# Patient Record
Sex: Male | Born: 1975 | Race: Black or African American | Hispanic: No | Smoking: Former smoker
Health system: Southern US, Community
[De-identification: ages and names within clinical notes are randomized; demographics above are authoritative.]

## PROBLEM LIST (undated history)

## (undated) DIAGNOSIS — Z720 Tobacco use: Secondary | ICD-10-CM

## (undated) DIAGNOSIS — K219 Gastro-esophageal reflux disease without esophagitis: Secondary | ICD-10-CM

## (undated) DIAGNOSIS — M109 Gout, unspecified: Secondary | ICD-10-CM

## (undated) DIAGNOSIS — E119 Type 2 diabetes mellitus without complications: Secondary | ICD-10-CM

## (undated) DIAGNOSIS — I739 Peripheral vascular disease, unspecified: Secondary | ICD-10-CM

## (undated) HISTORY — DX: Peripheral vascular disease, unspecified: I73.9

## (undated) HISTORY — PX: NO PAST SURGERIES: SHX2092

---

## 2008-03-18 ENCOUNTER — Emergency Department (HOSPITAL_COMMUNITY): Admission: EM | Admit: 2008-03-18 | Discharge: 2008-03-18 | Payer: Self-pay | Admitting: Emergency Medicine

## 2011-02-10 ENCOUNTER — Inpatient Hospital Stay (INDEPENDENT_AMBULATORY_CARE_PROVIDER_SITE_OTHER)
Admission: RE | Admit: 2011-02-10 | Discharge: 2011-02-10 | Disposition: A | Discharge status: Home / Self Care | Payer: Self-pay | Source: Ambulatory Visit | Attending: Family Medicine | Admitting: Family Medicine

## 2011-02-10 DIAGNOSIS — H6980 Other specified disorders of Eustachian tube, unspecified ear: Secondary | ICD-10-CM

## 2011-02-10 DIAGNOSIS — K219 Gastro-esophageal reflux disease without esophagitis: Secondary | ICD-10-CM

## 2011-02-10 DIAGNOSIS — F17200 Nicotine dependence, unspecified, uncomplicated: Secondary | ICD-10-CM

## 2011-02-10 DIAGNOSIS — G5620 Lesion of ulnar nerve, unspecified upper limb: Secondary | ICD-10-CM

## 2011-07-13 NOTE — Op Note (Signed)
  NAME:  Fred Green, Fred Green               ACCOUNT NO.:  [id number]  MEDICAL RECORD NO.:                     PATIENT TYPE:  LOCATION:                                 FACILITY:  PHYSICIAN:  Cindee Salt, M.D.            DATE OF BIRTH:  DATE OF PROCEDURE:  03/02/2011 DATE OF DISCHARGE:                              OPERATIVE REPORT   PREOPERATIVE DIAGNOSIS:  Carpal tunnel syndrome, right hand.  POSTOPERATIVE DIAGNOSIS:  Carpal tunnel syndrome, right hand.  OPERATION:  Decompression, right median nerve.  SURGEON:  Cindee Salt, MD  ASSISTANT:  None.  ANESTHESIA:  Forearm-based IV regional with local infiltration.  ANESTHESIOLOGIST:  Burna Forts, MD  HISTORY:  The patient is a 35 year old male with a history of carpal tunnel syndrome, EMG nerve conductions positive, which has not responded to conservative treatment.  He has elected to undergo surgical decompression.  Pre, peri, and postoperative course have been discussed along with risks and complications.  He is aware that there is no guarantee with surgery, possibility of infection, recurrence injury to arteries, nerves, tendons, incomplete relief of symptoms, dystrophy.  In the preoperative area, the patient is seen.  The extremity marked by both the patient and surgeon.  Antibiotic given.  PROCEDURE:  The patient was brought to the operating room where a forearm-based IV regional anesthetic was carried out without difficulty. Was prepped using ChloraPrep, supine position, right arm free.  A 3- minute dry time was allowed.  Time-out taken confirming the patient and procedure.  A longitudinal incision was made in the palm, carried down through subcutaneous tissue.  Bleeders were electrocauterized.  Palmar fascia was split.  Superficial palmar arch identified.  The flexor tendon to the ring little finger identified to the ulnar side of the median nerve.  The carpal retinaculum was incised with sharp dissection. A right-angle  and Sewall retractor were placed between skin and forearm fascia.  Fascia was released for approximately 1.5 cm proximal to the wrist crease under direct vision.  Canal was explored.  Air compression to the nerve was apparent.  No further lesions were identified.  The wound was irrigated.  Skin closed with interrupted 5-0 Vicryl Rapide sutures.  A local infiltration with 0.25% Marcaine without epinephrine was given, approximately 6 mL was used.  Sterile compressive dressing splint to the wrist with fingers free was applied.  On deflation of the tourniquet, all fingers immediately pinked.  He was taken to the recovery room for observation in satisfactory condition.  He will be discharged to home to return to the [facility] of [place] in 1 week on Vicodin.          ______________________________ Cindee Salt, M.D.     GK/MEDQ  D:  03/02/2011  T:  03/03/2011  Job:  [id number]  Electronically Signed by Cindee Salt M.D. on 07/13/2011 08:58:06 AM

## 2011-09-17 LAB — POCT I-STAT, CHEM 8
Calcium, Ion: 1.15
Creatinine, Ser: 1.3
Glucose, Bld: 95
Hemoglobin: 17
Potassium: 4.2

## 2011-09-17 LAB — URINALYSIS, ROUTINE W REFLEX MICROSCOPIC
Bilirubin Urine: NEGATIVE
Hgb urine dipstick: NEGATIVE
Ketones, ur: NEGATIVE
Nitrite: NEGATIVE
Specific Gravity, Urine: 1.023
Urobilinogen, UA: 1

## 2014-10-07 ENCOUNTER — Emergency Department (HOSPITAL_COMMUNITY): Payer: Worker's Compensation

## 2014-10-07 ENCOUNTER — Emergency Department (HOSPITAL_COMMUNITY)
Admission: EM | Admit: 2014-10-07 | Discharge: 2014-10-07 | Disposition: A | Discharge status: Home / Self Care | Payer: Worker's Compensation | Attending: Emergency Medicine | Admitting: Emergency Medicine

## 2014-10-07 ENCOUNTER — Encounter (HOSPITAL_COMMUNITY): Payer: Self-pay | Admitting: Emergency Medicine

## 2014-10-07 DIAGNOSIS — Z23 Encounter for immunization: Secondary | ICD-10-CM | POA: Diagnosis not present

## 2014-10-07 DIAGNOSIS — S62665B Nondisplaced fracture of distal phalanx of left ring finger, initial encounter for open fracture: Secondary | ICD-10-CM | POA: Insufficient documentation

## 2014-10-07 DIAGNOSIS — Z720 Tobacco use: Secondary | ICD-10-CM | POA: Diagnosis not present

## 2014-10-07 DIAGNOSIS — W230XXA Caught, crushed, jammed, or pinched between moving objects, initial encounter: Secondary | ICD-10-CM | POA: Insufficient documentation

## 2014-10-07 DIAGNOSIS — Y9269 Other specified industrial and construction area as the place of occurrence of the external cause: Secondary | ICD-10-CM | POA: Diagnosis not present

## 2014-10-07 DIAGNOSIS — S6992XA Unspecified injury of left wrist, hand and finger(s), initial encounter: Secondary | ICD-10-CM | POA: Diagnosis present

## 2014-10-07 DIAGNOSIS — Y990 Civilian activity done for income or pay: Secondary | ICD-10-CM | POA: Insufficient documentation

## 2014-10-07 MED ORDER — OXYCODONE-ACETAMINOPHEN 5-325 MG PO TABS
2.0000 | ORAL_TABLET | Freq: Once | ORAL | Status: AC
  Administered 2014-10-07: 2 via ORAL
  Filled 2014-10-07: qty 2

## 2014-10-07 MED ORDER — LIDOCAINE HCL 2 % IJ SOLN
10.0000 mL | Freq: Once | INTRAMUSCULAR | Status: AC
  Administered 2014-10-07: 200 mg via INTRADERMAL
  Filled 2014-10-07: qty 20

## 2014-10-07 MED ORDER — CEPHALEXIN 500 MG PO CAPS
500.0000 mg | ORAL_CAPSULE | Freq: Once | ORAL | Status: AC
  Administered 2014-10-07: 500 mg via ORAL
  Filled 2014-10-07: qty 1

## 2014-10-07 MED ORDER — OXYCODONE-ACETAMINOPHEN 5-325 MG PO TABS: 1.0000 | ORAL_TABLET | Freq: Four times a day (QID) | ORAL | Status: DC | PRN

## 2014-10-07 MED ORDER — TETANUS-DIPHTH-ACELL PERTUSSIS 5-2.5-18.5 LF-MCG/0.5 IM SUSP
0.5000 mL | Freq: Once | INTRAMUSCULAR | Status: AC
  Administered 2014-10-07: 0.5 mL via INTRAMUSCULAR
  Filled 2014-10-07: qty 0.5

## 2014-10-07 MED ORDER — CEPHALEXIN 500 MG PO CAPS: 1000.0000 mg | ORAL_CAPSULE | Freq: Two times a day (BID) | ORAL | Status: DC

## 2014-10-07 NOTE — Discharge Instructions (Signed)
Please follow up with hand specialist, Dr. Janee Mornhompson next week for further management of your broken finger.  Take antibiotic and pain medication as prescribed.  Avoid pain medication use while driving or operating heavy machinery as it may cause drowsiness.    Fingertip Injuries and Amputations Fingertip injuries are common and often get injured because they are last to escape when pulling your hand out of harm's way. You have amputated (cut off) part of your finger. How this turns out depends largely on how much was amputated. If just the tip is amputated, often the end of the finger will grow back and the finger may return to much the same as it was before the injury.  If more of the finger is missing, your caregiver has done the best with the tissue remaining to allow you to keep as much finger as is possible. Your caregiver after checking your injury has tried to leave you with a painless fingertip that has durable, feeling skin. If possible, your caregiver has tried to maintain the finger's length and appearance and preserve its fingernail.  Please read the instructions outlined below and refer to this sheet in the next few weeks. These instructions provide you with general information on caring for yourself. Your caregiver may also give you specific instructions. While your treatment has been done according to the most current medical practices available, unavoidable complications occasionally occur. If you have any problems or questions after discharge, please call your caregiver. HOME CARE INSTRUCTIONS   You may resume normal diet and activities as directed or allowed.  Keep your hand elevated above the level of your heart. This helps decrease pain and swelling.  Keep ice packs (or a bag of ice wrapped in a towel) on the injured area for 15-20 minutes, 03-04 times per day, for the first two days.  Change dressings if necessary or as directed.  Clean the wound daily or as directed.  Only  take over-the-counter or prescription medicines for pain, discomfort, or fever as directed by your caregiver.  Keep appointments as directed. SEEK IMMEDIATE MEDICAL CARE IF:  You develop redness, swelling, numbness or increasing pain in the wound.  There is pus coming from the wound.  You develop an unexplained oral temperature above 102 F (38.9 C) or as your caregiver suggests.  There is a foul (bad) smell coming from the wound or dressing.  There is a breaking open of the wound (edges not staying together) after sutures or staples have been removed. MAKE SURE YOU:   Understand these instructions.  Will watch your condition.  Will get help right away if you are not doing well or get worse. Document Released: 10/31/2005 Document Revised: 03/03/2012 Document Reviewed: 09/29/2008 [facility] Patient Information 2015 [employer] [place]. This information is not intended to replace advice given to you by your health care provider. Make sure you discuss any questions you have with your health care provider.

## 2014-10-07 NOTE — ED Notes (Signed)
Injury to tip of ring finger (4th finger) , l/hand. Pt was using a piece of cutting equipment at work. Tip of finger and nail was cut. Bleeding slowed with light pressure. Pt is alert, oriented and approtriate

## 2014-10-07 NOTE — ED Provider Notes (Signed)
CSN: [id number]     Arrival date & time 10/07/14  1304 History  This chart was scribed for non-physician practitioner working with Samuel JesterKathleen McManus, DO by Elveria Risingimelie Horne, ED Scribe. This patient was seen in room WTR7/WTR7 and the patient's care was started at 1:55 PM.   Chief Complaint  Patient presents with  . Finger Injury    avulsion of fingertip, slow bleed   The history is provided by the patient. No language interpreter was used.   HPI Comments: Fred Green is a 38 y.o. male who presents to the Emergency Department with left fourth finger injury incurred today at work, two hours ago. Patient works with [employer]; he states that he got his finger caught in conveyor belt. He attempted to yank his finger out effectively removing his nail and severing the distal tip of his fourth left finger. Patient went to US. There he was given digital block, dressing and referred to ED. Patient has not performed wound care or taken any medication.    Patient is uncertain of date of last Tetanus and is due for an update.   History reviewed. No pertinent past medical history. History reviewed. No pertinent past surgical history. Family History  Problem Relation Age of Onset  . Diabetes Mother   . Stroke Mother   . Cancer Mother   . Heart failure Father    History  Substance Use Topics  . Smoking status: Current Every Day Smoker    Types: Cigarettes  . Smokeless tobacco: Not on file  . Alcohol Use: No    Review of Systems  Constitutional: Negative for fever.  Neurological: Negative for weakness and numbness.   Allergies  Review of patient's allergies indicates no known allergies.  Home Medications   Prior to Admission medications   Not on File   Triage Vitals: BP 149/94  Pulse 92  Temp(Src) 98.4 F (36.9 C) (Oral)  Resp 20  SpO2 100%  Physical Exam  Nursing note and vitals reviewed. Constitutional: He is oriented to person, place, and time. He appears well-developed and  well-nourished. No distress.  HENT:  Head: Normocephalic and atraumatic.  Eyes: EOM are normal.  Neck: Neck supple.  Cardiovascular: Normal rate.   Pulmonary/Chest: Effort normal. No respiratory distress.  Musculoskeletal: Normal range of motion.  Neurological: He is alert and oriented to person, place, and time.  Skin: Skin is warm and dry.  Complete avulsion of nail in to bed with partial distal amputation of the left ring finger.   Psychiatric: He has a normal mood and affect. His behavior is normal.    ED Course  Procedures (including critical care time)  COORDINATION OF CARE: 1:58 PM- patient awaiting ordered pain medication and X-ray. Plans to repair. Discussed treatment plan with patient at bedside and patient agreed to plan.   2:44 PM Pt with an open fx of L index finger with complete avulsion of nail with a medial partial laceration.  Xray demonstrates multiple small densisites in the soft tissues suggesting possible small bone fragments or debris.  I discussed this finding with hand specialist, Dr. Janee Mornhompson who recommend irrigate wound, suture with chromic, xeroform dressing to nail bed and apply dressing. Will place finger in finger splint.  Work note provided.  Pt will f/u with him next week for further care.    4:09 PM LACERATION REPAIR Performed by: Fayrene HelperRAN,Johonna Binette Authorized byFayrene Helper: Evva Din Consent: Verbal consent obtained. Risks and benefits: risks, benefits and alternatives were discussed Consent given by: patient Patient identity  confirmed: provided demographic data Prepped and Draped in normal sterile fashion Wound explored  Laceration Location: L ring finger  Laceration Length: 2cm  No Foreign Bodies seen or palpated  Anesthesia: digital block  Local anesthetic: lidocaine 2% w/o epinephrine  Anesthetic total: 8 ml  Irrigation method: syringe Amount of cleaning: standard  Skin closure: chromic 5.0  Number of sutures: 7  Technique: simple interrupted,  surgical debridement with surgical scissor, skin approximation.  Patient tolerance: Patient tolerated the procedure well with no immediate complications.   Labs Review Labs Reviewed - No data to display  Imaging Review Dg Finger Ring Left  10/07/2014   CLINICAL DATA:  Left fourth finger tip laceration in machine at work.  EXAM: LEFT RING FINGER 2+V  COMPARISON:  None.  FINDINGS: Multiple small densities are seen in the soft tissues distal to the distal tuft of the fourth distal phalanx. It is uncertain if these represent small fractures or potentially debris or other foreign bodies. No other fracture or dislocation is noted. Joint spaces appear to be intact.  IMPRESSION: Multiple small densities are noted in the soft tissues distal to the distal tuft of the fourth distal phalanx concerning for possible small bone fragments or possibly debris or other foreign bodies.   Electronically Signed   By: Roque LiasJames  Green M.D.   On: 10/07/2014 14:12     EKG Interpretation None      MDM   Final diagnoses:  Nondisplaced fracture of distal phalanx of left ring finger, open, initial encounter    BP 149/94  Pulse 92  Temp(Src) 98.4 F (36.9 C) (Oral)  Resp 20  SpO2 100%  I have reviewed nursing notes and vital signs. I personally reviewed the imaging tests through PACS system  I reviewed available ER/hospitalization records thought the EMR   I personally performed the services described in this documentation, which was scribed in my presence. The recorded information has been reviewed and is accurate.    Fayrene HelperBowie Lanson Randle, PA-C 10/07/14 [phone]

## 2014-10-08 NOTE — ED Provider Notes (Signed)
Medical screening examination/treatment/procedure(s) were performed by non-physician practitioner and as supervising physician I was immediately available for consultation/collaboration.   EKG Interpretation None        Samuel JesterKathleen Nikol Lemar, DO 10/08/14 [phone]

## 2015-02-14 ENCOUNTER — Emergency Department (HOSPITAL_COMMUNITY)
Admission: EM | Admit: 2015-02-14 | Discharge: 2015-02-14 | Disposition: A | Discharge status: Home / Self Care | Payer: Self-pay | Attending: Emergency Medicine | Admitting: Emergency Medicine

## 2015-02-14 ENCOUNTER — Encounter (HOSPITAL_COMMUNITY): Payer: Self-pay

## 2015-02-14 DIAGNOSIS — K088 Other specified disorders of teeth and supporting structures: Secondary | ICD-10-CM | POA: Insufficient documentation

## 2015-02-14 DIAGNOSIS — Z792 Long term (current) use of antibiotics: Secondary | ICD-10-CM | POA: Insufficient documentation

## 2015-02-14 DIAGNOSIS — Z720 Tobacco use: Secondary | ICD-10-CM | POA: Insufficient documentation

## 2015-02-14 DIAGNOSIS — K0889 Other specified disorders of teeth and supporting structures: Secondary | ICD-10-CM

## 2015-02-14 MED ORDER — PENICILLIN V POTASSIUM 500 MG PO TABS: 500.0000 mg | ORAL_TABLET | Freq: Four times a day (QID) | ORAL | Status: AC

## 2015-02-14 MED ORDER — BUPIVACAINE-EPINEPHRINE (PF) 0.5% -1:200000 IJ SOLN
1.8000 mL | Freq: Once | INTRAMUSCULAR | Status: AC
  Administered 2015-02-14: 1.8 mL
  Filled 2015-02-14: qty 1.8

## 2015-02-14 MED ORDER — PENICILLIN V POTASSIUM 500 MG PO TABS
500.0000 mg | ORAL_TABLET | Freq: Once | ORAL | Status: AC
  Administered 2015-02-14: 500 mg via ORAL
  Filled 2015-02-14: qty 1

## 2015-02-14 MED ORDER — HYDROCODONE-ACETAMINOPHEN 5-325 MG PO TABS: 1.0000 | ORAL_TABLET | ORAL | Status: DC | PRN

## 2015-02-14 NOTE — Discharge Instructions (Signed)
Return to the emergency room with worsening of symptoms, new symptoms or with symptoms that are concerning, especially fevers, nausea, vomiting, drooling, unable to open mouth, worsening facial swelling, chest pain, shortness of breath. Please take all of your antibiotics until finished!   You may develop abdominal discomfort or diarrhea from the antibiotic.  You may help offset this with probiotics which you can buy or get in yogurt. Do not eat  or take the probiotics until 2 hours after your antibiotic.  Ibuprofen  (2 tablets ) every 5-6 hours for 3-5 days. Norco for severe pain. Do not operate machinery, drive or drink alcohol while taking narcotics or muscle relaxers. Use below resources to establish care with dentist as soon as possible. Also call to make appointment with wellness center to establish care. Your blood pressure was elevated. Have your blood pressure rechecked. Read below information and follow recommendations.   Dental Pain A tooth ache may be caused by cavities (tooth decay). Cavities expose the nerve of the tooth to air and hot or cold temperatures. It may come from an infection or abscess (also called a boil or furuncle) around your tooth. It is also often caused by dental caries (tooth decay). This causes the pain you are having. DIAGNOSIS  Your caregiver can diagnose this problem by exam. TREATMENT   If caused by an infection, it may be treated with medications which kill germs (antibiotics) and pain medications as prescribed by your caregiver. Take medications as directed.  Only take over-the-counter or prescription medicines for pain, discomfort, or fever as directed by your caregiver.  Whether the tooth ache today is caused by infection or dental disease, you should see your dentist as soon as possible for further care. SEEK MEDICAL CARE IF: The exam and treatment you received today has been provided on an emergency basis only. This is not a substitute for  complete medical or dental care. If your problem worsens or new problems (symptoms) appear, and you are unable to meet with your dentist, call or return to this location. SEEK IMMEDIATE MEDICAL CARE IF:   You have a fever.  You develop redness and swelling of your face, jaw, or neck.  You are unable to open your mouth.  You have severe pain uncontrolled by pain medicine. MAKE SURE YOU:   Understand these instructions.  Will watch your condition.  Will get help right away if you are not doing well or get worse. Document Released: 12/10/2005 Document Revised: 03/03/2012 Document Reviewed: 07/28/2008 [facility] Patient Information 2015 [place], [place]. This information is not intended to replace advice given to you by your health care provider. Make sure you discuss any questions you have with your health care provider.   Emergency Department Resource Guide 1) Find a Doctor and Pay Out of Pocket Although you won't have to find out who is covered by your insurance plan, it is a good idea to ask around and get recommendations. You will then need to call the office and see if the doctor you have chosen will accept you as a new patient and what types of options they offer for patients who are self-pay. Some doctors offer discounts or will set up payment plans for their patients who do not have insurance, but you will need to ask so you aren't surprised when you get to your appointment.  2) Contact Your Local Health Department Not all health departments have doctors that can see patients for sick visits, but many do, so it is worth  a call to see if yours does. If you don't know where your local health department is, you can check in your phone book. The CDC also has a tool to help you locate your state's health department, and many state websites also have listings of all of their local health departments.  3) Find a Walk-in Clinic If your illness is not likely to be very severe or complicated,  you may want to try a walk in clinic. These are popping up all over the country in pharmacies, drugstores, and shopping centers. They're usually staffed by nurse practitioners or physician assistants that have been trained to treat common illnesses and complaints. They're usually fairly quick and inexpensive. However, if you have serious medical issues or chronic medical problems, these are probably not your best option.  No Primary Care Doctor: - Call Health Connect at  [phone] - they can help you locate a primary care doctor that  accepts your insurance, provides certain services, etc. - Physician Referral Service- [phone]  Chronic Pain Problems: Organization         Address  Phone   Notes  [facility]  [phone] Patients need to be referred by their primary care doctor.   Medication Assistance: Organization         Address  Phone   Notes  [facility] Medication [facility] [address] [place], [place] [address] [phone] --Must be a resident of [facility] -- Must have NO insurance coverage whatsoever (no Medicaid/ Medicare, etc.) -- The pt. MUST have a primary care doctor that directs their care regularly and follows them in the community   [facility]ist  [phone]   [employer]  [phone]    Agencies that provide inexpensive medical care: Organization         Address  Phone   Notes  [facility]  [phone]   [facility]    [phone]   [facility] [address] [place], [place] [address] [phone]   [facility] [address] [place] [place] [phone]   Planned Parenthood    [phone]   [facility]    [phone]   [facility]  [address], [place] Phone:  [phone], Fax:  [phone] Hours of Operation:  9 am - 6 pm, M-F.  Also accepts Medicaid/Medicare and  self-pay.  [facility]  [address], [place] Phone: [phone], Fax: [phone]. Hours of Operation:  8:30 am - 5:30 pm, M-F.  Also accepts Medicaid and self-pay.  [facility] [place] [address] [place] Phone: [phone]   [facility] [address] Natasha Bence [place], [place] [phone], Ext. [phone] Mondays & Thursdays: 7-9 AM.  First 15 patients are seen on a first come, first serve basis.    Medicaid-accepting [facility] Providers:  Organization         Address  Phone   Notes  [facility] [address],  [phone] Also accepts self-pay patients.  [facility] [address] Laurell Josephs [place], [place]  [phone]   [facility] [address], [place] [phone]   [facility] [address], [place] [phone]   Renaye Rakers [address] [place], [place]  7, [place]   [phone]) [phone] Only accepts [place] Access Medicaid patients after they have their name applied to their card.   Self-Pay (no insurance) in [facility]:  Organization         Address  Phone   Notes  Sickle Cell Patients, [facility] [address], [place] [phone]) [phone]   [facility] [address], [place] [phone]) [phone]   [facility]  [address], [place] [phone]   [facility]/Dr. Osei-Bonsu  [address] [place] [address], [place] or [phone], [place] [phone] Phone number for both [place] and [place] locations is the same.  [facility] [address], [place] [phone]) [phone]   [facility] [address] [place] [address], [place] or [address] [phone]) [phone]   [facility] [address], [place] [phone], phone;  [phone], fax Sees patients 1st and 3rd Saturday of every month.  Must not qualify for public or private insurance (i.e. Medicaid, Medicare, [place] Health Choice, Veterans' Benefits)  Household income should be no more than 200% of the poverty level The clinic cannot treat you if you are pregnant or think you are pregnant  Sexually transmitted diseases are not treated at the clinic.    Dental Care: Organization         Address  Phone  Notes  [facility] [address], [place] [phone]) [phone] Accepts children up to age 40 who are enrolled in [place]icaid or [place] Health Choice; pregnant women with a Medicaid card; and children who have applied for Medicaid or [place] Health Choice, but were declined, whose parents can pay a reduced fee at time of service.  [facility] [place]  [address], [place] [phone] Accepts children up to age 40 who are enrolled in [place]icaid or [place] Health Choice; pregnant women with a Medicaid card; and children who have applied for Medicaid or [place] Health Choice, but were declined, whose parents can pay a reduced fee at time of service.  [facility]  [address], [place] [phone]) [phone] Patients are seen by appointment only. Walk-ins are not accepted. [facility] will see patients 40 years of age and older. Monday - Tuesday (8am-5pm) Most Wednesdays (8:30-5pm) $30 per visit, cash only  [facility]  [address], [facility] [phone] Patients are seen by appointment only. Walk-ins are not accepted. [facility] will see patients 40 years of age and older. One Wednesday Evening (Monthly: Volunteer Based).  $30 per visit, cash only  [employer] [facility]  [phone] for adults; Children under age 554, call Graduate Pediatric Dentistry at [phone]) [phone]. Children aged 804-14, please call  [phone] to request a pediatric application.  Dental services are provided in all areas of dental care including fillings, crowns and bridges, complete and partial dentures, implants, gum treatment, root canals, and extractions. Preventive care is also provided. Treatment is provided to both adults and children. Patients are selected via a lottery and there is often a waiting list.   [facility] [address], [place]  [phone] [email or web address]   [facility] [address], [place], [place] [phone], Ext. [phone] Second and Fourth Thursday of each  month, opens at 6:30 AM; Clinic ends at 9 AM.  Patients are seen on a first-come first-served basis, and a limited number are seen during each clinic.   [facility]  [address], [place], [place] [phone]) [phone]   Eligibility Requirements You must have lived in [place], [place], or [place] counties for at least the last three months.   You cannot be eligible for state or federal sponsored [place] Cityhealthcare insurance, including CIGNAVeterans Administration, [place]icaid, or [employer]Medicare.   You generally cannot be eligible for healthcare insurance through your employer.    How to apply: Eligibility screenings are held every Tuesday and Wednesday afternoon from 1:00 pm until 4:00 pm. You do not need an appointment for the interview!  [facility] [address], [place], [place] [phone]   [facility]  [phone]   [facility]  [phone]   [facility]  [phone]    Behavioral Health Resources in the Community: Intensive Outpatient Programs Organization         Address  Phone  Notes  [facility] [address], [place], [place] [phone]   [facility] [address], [place], [place] [phone]   [facility] [address], [place], [place]   [phone]   [facility] [address],  [place], [place] [phone] or [phone]   Substance Abuse Resources Organization         Address  Phone  Notes  [facility]  [phone]   [facility]  [phone]   [facility]  [phone]   [facility]  [phone]   Residential & Outpatient Substance Abuse Program  [phone]   Psychological Services Organization         Address  Phone  Notes  [facility]  [phone]- [phone]   [facility]  [phone]- [phone]   [facility] [address], [place] [phone] or [phone]    Mobile Crisis Teams Organization         Address  Phone  Notes  [facility], Mobile Crisis Care Unit  [phone]   [facility]  [address] [place], [place] [phone]   Doristine LocksSharon DeEsch [address] [place] [phone]    Self-Help/Support Groups Organization         Address  Phone             Notes  [facility] [place] - variety of support groups  336- [phone] Call for more information  Narcotics Anonymous (NA), [facility] [address], [place]   2 meetings at this location   Statisticianesidential Treatment Programs Organization         Address  Phone  Notes  [facility] [address],    [place]  [phone]   [facility]  [address], [place] [phone], [place], [place] [phone]   [facility] [address], [place] [phone] Admissions: 8am-3pm M-F  [facility] [address]    [place], [place] [phone]   [facility] [address], [place], [place] [phone]   [facility] [address]  [place], [place] [phone]   [facility] - Intensive Outpatient [address], [place], [place] [phone]   [facility] (Addiction Recovery  Care Assoc[facility] [address]  [place],  [place] [phone] or [phone]   [facility] (RT[facility]) [address] [place], [place] [phone] Accepts Medicaid  [facility] [address]  [place] [phone] Substance Abuse/Addiction Treatment   [facility] Resources Organization         Address  Phone  Notes  [facility]  [phone]) [phone]   Angie FavaJulie Brannon, PhD [address], [address] [place], [place]   [phone]) [phone] or [phone]   [facility]   [address] [place], [place] [phone]   [facility] [address], [place], [place] [phone] Insurance/Medicaid/sponsorship through [facility]  Faith and Families [address]                                    [place], [place] [phone] Therapy/tele-psych/case  [facility] [address]   [place], [place] [phone]) [phone]    Dr. Lolly MustacheArfeen  [phone](336) [phone]   [facility]  [employer] [facility] 1) [address], [place] 2) [address], [place] 3)  [address], [place] [phone]  [phone](336) [phone]   [facility] Child Abuse Hotline [phone](336) [phone] or [phone](336) [phone] (After Hours)
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
## 2015-02-14 NOTE — ED Provider Notes (Signed)
CSN: [id number]     Arrival date & time 02/14/15  0805 History   First MD Initiated Contact with Patient 02/14/15 [phone]     Chief Complaint  Patient presents with  . Dental Pain  . Facial Swelling     (Consider location/radiation/quality/duration/timing/severity/associated sxs/prior Treatment) HPI  Rakesh R Conard is a 39 y.o. male presenting with 6 day history of dental pain left lower molar. He is also noted 2 days of facial swelling and is concerned with an abscess. He has been taking ibuprofen with some relief of his tenderness. No fevers, chills, nausea, vomiting. Patient denies any drooling says he has been spitting more. Decreased oral intake due to the pain. No chest pain, shortness of breath. History of diabetes. Patient cannot remember last time he went to the dentist.   History reviewed. No pertinent past medical history. History reviewed. No pertinent past surgical history. Family History  Problem Relation Age of Onset  . Diabetes Mother   . Stroke Mother   . Cancer Mother   . Heart failure Father    History  Substance Use Topics  . Smoking status: Current Every Day Smoker    Types: Cigarettes  . Smokeless tobacco: Not on file  . Alcohol Use: No    Review of Systems  Constitutional: Negative for fever and chills.  HENT: Positive for dental problem and facial swelling. Negative for drooling.   Cardiovascular: Negative for chest pain and palpitations.  Gastrointestinal: Negative for nausea and vomiting.  Neurological: Negative for weakness.      Allergies  Review of patient's allergies indicates no known allergies.  Home Medications   Prior to Admission medications   Medication Sig Start Date End Date Taking? Authorizing Provider  amoxicillin (AMOXIL) 500 MG capsule Take 500 mg by mouth once.   Yes Historical Provider, MD  ibuprofen (ADVIL,MOTRIN) 200 MG tablet Take 400 mg by mouth every 6 (six) hours as needed (For tooth pain.).   Yes Historical Provider,  MD  cephALEXin (KEFLEX) 500 MG capsule Take 2 capsules (1,000 mg total) by mouth 2 (two) times daily. Patient not taking: Reported on 02/14/2015 10/07/14   Fayrene Helper, PA-C  HYDROcodone-acetaminophen (NORCO/VICODIN) 5-325 MG per tablet Take 1-2 tablets by mouth every 4 (four) hours as needed. 02/14/15   Louann Sjogren, PA-C  oxyCODONE-acetaminophen (PERCOCET/ROXICET) 5-325 MG per tablet Take 1 tablet by mouth every 6 (six) hours as needed for moderate pain or severe pain. Patient not taking: Reported on 02/14/2015 10/07/14   Fayrene Helper, PA-C  penicillin v potassium (VEETID) 500 MG tablet Take 1 tablet (500 mg total) by mouth 4 (four) times daily. 02/14/15 02/21/15  Benetta Spar L Derriona Branscom, PA-C   BP 178/99 mmHg  Pulse 94  Temp(Src) 98.3 F (36.8 C) (Oral)  Resp 16  SpO2 100% Physical Exam  Constitutional: He appears well-developed and well-nourished. No distress.  HENT:  Head: Normocephalic and atraumatic.  Mouth/Throat: No oropharyngeal exudate.  No trismus or uvula deviation No lip, tongue swelling. No swelling under tongue or tenderness. Mild left-sided facial swelling. Patient handling secretions. No drooling. Patient with poor dentition. Patient with tenderness to left lower back molar with mild erythema in this gingiva and possible drainable abscess.   Eyes: Conjunctivae are normal. Right eye exhibits no discharge. Left eye exhibits no discharge.  Neck: Normal range of motion. Neck supple.  Left cervical adenopathy  Cardiovascular: Normal rate and regular rhythm.   Pulmonary/Chest: Effort normal and breath sounds normal. No respiratory distress. He has no  wheezes.  Abdominal: Soft. He exhibits no distension. There is no tenderness.  Neurological: He is alert. Coordination normal.  Skin: Skin is warm and dry. He is not diaphoretic.  Nursing note and vitals reviewed.   ED Course  NERVE BLOCK Date/Time: 02/14/2015 9:03 AM Performed by: Arilynn Blakeney L Authorized by: Oswaldo ConroyREECH,  Savvas Roper L Consent: Verbal consent obtained. Consent given by: patient Patient understanding: patient states understanding of the procedure being performed Patient consent: the patient's understanding of the procedure matches consent given Relevant documents: relevant documents present and verified Site marked: the operative site was marked Patient identity confirmed: verbally with patient Time out: Immediately prior to procedure a "time out" was called to verify the correct patient, procedure, equipment, support staff and site/side marked as required. Indications: pain relief Body area: face/mouth Nerve: inferior alveolar Local anesthetic: bupivacaine 0.5% with epinephrine Anesthetic total: 1.8 ml Outcome: pain improved Patient tolerance: Patient tolerated the procedure well with no immediate complications Comments: I and d of tooth abscess with minimal purulent drainage after incision with 18g needle   (including critical care time) Labs Review Labs Reviewed - No data to display  Imaging Review No results found.   EKG Interpretation None        MDM   Final diagnoses:  Pain, dental   Patient with toothache.  Questionable abscess verses gingival inflammation. No history of diabetes. Exam unconcerning for Ludwig's angina or spread of infection. Discussed nerve block and IandD with pt who is agreeable. Dental block with marcaine see above. Pt tolerated procedure without complications and decrease pain. I and D with minimal drainage.  Will treat with penicillin and pain medicine.  Driving and sedation precautions provided. Urged patient to follow-up with dentist.  ED resources provided.  Discussed return precautions with patient. Discussed all results and patient verbalizes understanding and agrees with plan.   Louann SjogrenVictoria L Angelea Penny, PA-C 02/14/15 1110  Ethelda ChickMartha K Linker, MD 02/14/15 1120

## 2015-02-14 NOTE — ED Notes (Signed)
Pt with dental pain starting Wednesday.  Noticed facial swelling last 2 days.

## 2016-01-11 ENCOUNTER — Encounter (HOSPITAL_COMMUNITY): Payer: Self-pay | Admitting: *Deleted

## 2016-01-11 ENCOUNTER — Emergency Department (HOSPITAL_COMMUNITY): Payer: Self-pay

## 2016-01-11 ENCOUNTER — Observation Stay (HOSPITAL_COMMUNITY)
Admission: EM | Admit: 2016-01-11 | Discharge: 2016-01-12 | Disposition: A | Discharge status: Home / Self Care | Payer: Self-pay | Attending: Internal Medicine | Admitting: Internal Medicine

## 2016-01-11 DIAGNOSIS — K219 Gastro-esophageal reflux disease without esophagitis: Secondary | ICD-10-CM | POA: Insufficient documentation

## 2016-01-11 DIAGNOSIS — F17210 Nicotine dependence, cigarettes, uncomplicated: Secondary | ICD-10-CM | POA: Insufficient documentation

## 2016-01-11 DIAGNOSIS — M549 Dorsalgia, unspecified: Secondary | ICD-10-CM | POA: Diagnosis present

## 2016-01-11 DIAGNOSIS — R55 Syncope and collapse: Secondary | ICD-10-CM

## 2016-01-11 DIAGNOSIS — Z720 Tobacco use: Secondary | ICD-10-CM | POA: Diagnosis present

## 2016-01-11 DIAGNOSIS — R079 Chest pain, unspecified: Secondary | ICD-10-CM

## 2016-01-11 DIAGNOSIS — R9431 Abnormal electrocardiogram [ECG] [EKG]: Principal | ICD-10-CM | POA: Insufficient documentation

## 2016-01-11 HISTORY — DX: Gastro-esophageal reflux disease without esophagitis: K21.9

## 2016-01-11 HISTORY — DX: Tobacco use: Z72.0

## 2016-01-11 LAB — COMPREHENSIVE METABOLIC PANEL
ALK PHOS: 78 U/L (ref 38–126)
ALT: 22 U/L (ref 17–63)
ANION GAP: 10 (ref 5–15)
AST: 19 U/L (ref 15–41)
Albumin: 4.5 g/dL (ref 3.5–5.0)
BUN: 12 mg/dL (ref 6–20)
CALCIUM: 9.9 mg/dL (ref 8.9–10.3)
CO2: 26 mmol/L (ref 22–32)
Chloride: 105 mmol/L (ref 101–111)
Creatinine, Ser: 0.92 mg/dL (ref 0.61–1.24)
GFR calc non Af Amer: >60 mL/min (ref >60)
Glucose, Bld: 107 mg/dL — ABNORMAL HIGH (ref 65–99)
POTASSIUM: 4 mmol/L (ref 3.5–5.1)
SODIUM: 141 mmol/L (ref 135–145)
Total Bilirubin: 0.7 mg/dL (ref 0.3–1.2)
Total Protein: 8 g/dL (ref 6.5–8.1)

## 2016-01-11 LAB — I-STAT CHEM 8, ED
BUN: 10 mg/dL (ref 6–20)
CHLORIDE: 102 mmol/L (ref 101–111)
Calcium, Ion: 1.16 mmol/L (ref 1.12–1.23)
Creatinine, Ser: 0.9 mg/dL (ref 0.61–1.24)
Glucose, Bld: 103 mg/dL — ABNORMAL HIGH (ref 65–99)
HEMATOCRIT: 48 % (ref 39.0–52.0)
Hemoglobin: 16.3 g/dL (ref 13.0–17.0)
Potassium: 3.7 mmol/L (ref 3.5–5.1)
SODIUM: 140 mmol/L (ref 135–145)
TCO2: 25 mmol/L (ref 0–100)

## 2016-01-11 LAB — CBC WITH DIFFERENTIAL/PLATELET
Basophils Absolute: 0 10*3/uL (ref 0.0–0.1)
Basophils Relative: 0 %
EOS ABS: 0.3 10*3/uL (ref 0.0–0.7)
EOS PCT: 3 %
HCT: 44.6 % (ref 39.0–52.0)
HEMOGLOBIN: 15.5 g/dL (ref 13.0–17.0)
LYMPHS ABS: 3.7 10*3/uL (ref 0.7–4.0)
Lymphocytes Relative: 38 %
MCH: 30.5 pg (ref 26.0–34.0)
MCHC: 34.8 g/dL (ref 30.0–36.0)
MCV: 87.8 fL (ref 78.0–100.0)
MONO ABS: 0.5 10*3/uL (ref 0.1–1.0)
MONOS PCT: 6 %
Neutro Abs: 5.1 10*3/uL (ref 1.7–7.7)
Neutrophils Relative %: 53 %
PLATELETS: 380 10*3/uL (ref 150–400)
RBC: 5.08 MIL/uL (ref 4.22–5.81)
RDW: 13.2 % (ref 11.5–15.5)
WBC: 9.6 10*3/uL (ref 4.0–10.5)

## 2016-01-11 LAB — PROTIME-INR
INR: 1.06 (ref 0.00–1.49)
Prothrombin Time: 14 seconds (ref 11.6–15.2)

## 2016-01-11 LAB — I-STAT TROPONIN, ED: TROPONIN I, POC: 0 ng/mL (ref 0.00–0.08)

## 2016-01-11 LAB — APTT: aPTT: 32 seconds (ref 24–37)

## 2016-01-11 LAB — RAPID URINE DRUG SCREEN, HOSP PERFORMED
AMPHETAMINES: NOT DETECTED
Barbiturates: NOT DETECTED
Benzodiazepines: NOT DETECTED
Cocaine: NOT DETECTED
OPIATES: NOT DETECTED
Tetrahydrocannabinol: NOT DETECTED

## 2016-01-11 LAB — TROPONIN I: Troponin I: <0.03 ng/mL (ref <0.031)

## 2016-01-11 MED ORDER — IBUPROFEN 200 MG PO TABS: 400.0000 mg | ORAL_TABLET | Freq: Three times a day (TID) | ORAL | Status: DC | PRN

## 2016-01-11 MED ORDER — NITROGLYCERIN 0.4 MG SL SUBL: 0.4000 mg | SUBLINGUAL_TABLET | SUBLINGUAL | Status: DC | PRN

## 2016-01-11 MED ORDER — ZOLPIDEM TARTRATE 5 MG PO TABS: 5.0000 mg | ORAL_TABLET | Freq: Every evening | ORAL | Status: DC | PRN

## 2016-01-11 MED ORDER — NICOTINE 14 MG/24HR TD PT24: 14.0000 mg | MEDICATED_PATCH | Freq: Every day | TRANSDERMAL | Status: DC

## 2016-01-11 MED ORDER — IOHEXOL 350 MG/ML SOLN
100.0000 mL | Freq: Once | INTRAVENOUS | Status: AC | PRN
  Administered 2016-01-11: 100 mL via INTRAVENOUS

## 2016-01-11 MED ORDER — ACETAMINOPHEN 325 MG PO TABS: 650.0000 mg | ORAL_TABLET | ORAL | Status: DC | PRN

## 2016-01-11 MED ORDER — HYDRALAZINE HCL 20 MG/ML IJ SOLN: 5.0000 mg | INTRAMUSCULAR | Status: DC | PRN

## 2016-01-11 MED ORDER — ONDANSETRON HCL 4 MG/2ML IJ SOLN: 4.0000 mg | Freq: Four times a day (QID) | INTRAMUSCULAR | Status: DC | PRN

## 2016-01-11 MED ORDER — SODIUM CHLORIDE 0.9 % IV SOLN
INTRAVENOUS | Status: DC
  Administered 2016-01-11: 21:00:00 via INTRAVENOUS

## 2016-01-11 MED ORDER — ASPIRIN 325 MG PO TABS
325.0000 mg | ORAL_TABLET | Freq: Every day | ORAL | Status: DC
  Administered 2016-01-12: 325 mg via ORAL
  Filled 2016-01-11: qty 1

## 2016-01-11 MED ORDER — PANTOPRAZOLE SODIUM 40 MG PO TBEC
40.0000 mg | DELAYED_RELEASE_TABLET | Freq: Every day | ORAL | Status: DC
  Administered 2016-01-12: 40 mg via ORAL
  Filled 2016-01-11: qty 1

## 2016-01-11 MED ORDER — ENOXAPARIN SODIUM 40 MG/0.4ML ~~LOC~~ SOLN
40.0000 mg | Freq: Every day | SUBCUTANEOUS | Status: DC
Start: 2016-01-11 — End: 2016-01-12
  Administered 2016-01-11: 40 mg via SUBCUTANEOUS
  Filled 2016-01-11 (×2): qty 0.4

## 2016-01-11 MED ORDER — ATORVASTATIN CALCIUM 40 MG PO TABS
40.0000 mg | ORAL_TABLET | Freq: Every day | ORAL | Status: DC
  Administered 2016-01-11: 40 mg via ORAL
  Filled 2016-01-11 (×2): qty 1

## 2016-01-11 NOTE — ED Provider Notes (Signed)
CSN: [id number]     Arrival date & time 01/11/16  1544 History  By signing my name below, I, Marica Otter, attest that this documentation has been prepared under the direction and in the presence of  [employer], PA-C. Electronically Signed: Marica Otter, ED Scribe. 01/11/2016. 4:50 PM.  Chief Complaint  Patient presents with  . Back Pain  . Near Syncope   HPI PCP: No primary care provider on file. HPI Comments: Fred Green is a 40 y.o. male who presents to the Emergency Department complaining of atraumatic, sudden onset, sharp, shooting, now resolved, 15 minute episode of upper back pain originating on the right upper back and shooting to the left upper back onset at 3pm today while pt was picking up papers from a box on the floor. Associated Sx include an improving headache also onset at 3pm today; and an episode of lightheadedness immediately following onset of the back pain which is now resolved. Pt denies chest pain, SOB, or any other Sx at this time. Pt further denies Hx of HTN.   Past Medical History  Diagnosis Date  . GERD (gastroesophageal reflux disease)   . Tobacco abuse    History reviewed. No pertinent past surgical history. Family History  Problem Relation Age of Onset  . Diabetes Mother   . Stroke Mother   . Cancer Mother   . Heart failure Father   . Heart attack Father    Social History  Substance Use Topics  . Smoking status: Current Every Day Smoker    Types: Cigarettes  . Smokeless tobacco: None  . Alcohol Use: No    Review of Systems  A complete 10 system review of systems was obtained and all systems are negative except as noted in the HPI and PMH.   Allergies  Review of patient's allergies indicates no known allergies.  Home Medications   Prior to Admission medications   Medication Sig Start Date End Date Taking? Authorizing Provider  ibuprofen (ADVIL,MOTRIN) 200 MG tablet Take 400 mg by mouth every 6 (six) hours as needed (For tooth  pain.).   Yes Historical Provider, MD  cephALEXin (KEFLEX) 500 MG capsule Take 2 capsules (1,000 mg total) by mouth 2 (two) times daily. Patient not taking: Reported on 02/14/2015 10/07/14   Fayrene Helper, PA-C  HYDROcodone-acetaminophen (NORCO/VICODIN) 5-325 MG per tablet Take 1-2 tablets by mouth every 4 (four) hours as needed. Patient not taking: Reported on 01/11/2016 02/14/15   Oswaldo Conroy, PA-C  oxyCODONE-acetaminophen (PERCOCET/ROXICET) 5-325 MG per tablet Take 1 tablet by mouth every 6 (six) hours as needed for moderate pain or severe pain. Patient not taking: Reported on 02/14/2015 10/07/14   Fayrene Helper, PA-C   Triage Vitals: BP 139/84 mmHg  Pulse 93  Temp(Src) 98.1 F (36.7 C) (Oral)  Resp 18  SpO2 96% Physical Exam  Constitutional: He is oriented to person, place, and time. He appears well-developed and well-nourished. No distress.  HENT:  Head: Normocephalic.  Mouth/Throat: Oropharynx is clear and moist.  Eyes: Conjunctivae and EOM are normal.  Neck: Normal range of motion. No JVD present. No tracheal deviation present.  Cardiovascular: Normal rate, regular rhythm and intact distal pulses.   Radial pulse equal bilaterally  Pulmonary/Chest: Effort normal and breath sounds normal. No stridor. No respiratory distress. He has no wheezes. He has no rales. He exhibits no tenderness.  Abdominal: Soft. He exhibits no distension and no mass. There is no tenderness. There is no rebound and no guarding.  Musculoskeletal: Normal  range of motion. He exhibits no edema or tenderness.  No calf asymmetry, superficial collaterals, palpable cords, edema, Homans sign negative bilaterally.    Neurological: He is alert and oriented to person, place, and time.  Skin: Skin is warm. He is not diaphoretic.  Psychiatric: He has a normal mood and affect.  Nursing note and vitals reviewed.  ED Course  Procedures (including critical care time) DIAGNOSTIC STUDIES: Oxygen Saturation is 96% on ra, nl by  my interpretation.    COORDINATION OF CARE: 4:42 PM: Discussed treatment plan which includes chest x-ray, EKG and labs with pt at bedside; patient verbalizes understanding and agrees with treatment plan.  Labs Review Labs Reviewed  COMPREHENSIVE METABOLIC PANEL - Abnormal; Notable for the following:    Glucose, Bld 107 (*)    All other components within normal limits  I-STAT CHEM 8, ED - Abnormal; Notable for the following:    Glucose, Bld 103 (*)    All other components within normal limits  CBC WITH DIFFERENTIAL/PLATELET  PROTIME-INR  URINE RAPID DRUG SCREEN, HOSP PERFORMED  I-STAT TROPOININ, ED    Imaging Review Dg Chest 2 View  01/11/2016  CLINICAL DATA:  Mild mid chest pain radiating to RIGHT upper back and LEFT upper back, smoker EXAM: CHEST  2 VIEW COMPARISON:  03/18/2008 FINDINGS: Minimal enlargement of cardiac silhouette. Slight pulmonary vascular congestion. Mediastinal contours normal. Lungs clear. No pleural effusion or pneumothorax. Bones unremarkable. IMPRESSION: Minimal enlargement of cardiac silhouette with pulmonary vascular congestion. No acute infiltrate. Electronically Signed   By: Ulyses Southward M.D.   On: 01/11/2016 17:12   Ct Angio Chest Aorta W/cm &/or Wo/cm  01/11/2016  CLINICAL DATA:  40 year old male with history of sudden onset of sharp shooting chest pain radiating from the right upper back to the left upper back at 3 p.m. today while picking up a heavy box off the floor. Headache. Lightheadedness. EXAM: CT ANGIOGRAPHY CHEST WITH CONTRAST TECHNIQUE: Multidetector CT imaging of the chest was performed using the standard protocol during bolus administration of intravenous contrast. Multiplanar CT image reconstructions and MIPs were obtained to evaluate the vascular anatomy. CONTRAST:  OMNIPAQUE IOHEXOL 350 MG/ML SOLN COMPARISON:  No priors. FINDINGS: Mediastinum/Lymph Nodes: Noncontrast images demonstrate no crescentic high attenuation associated with the wall of  the thoracic aorta to suggest acute intramural hemorrhage. No aneurysm or dissection identified in the thoracic aorta on post-contrast images. Heart size is normal. There is no significant pericardial fluid, thickening or pericardial calcification. No pathologically enlarged mediastinal or hilar lymph nodes. Esophagus is unremarkable in appearance. No axillary lymphadenopathy. Lungs/Pleura: No pneumothorax. No acute consolidative airspace disease. No pleural effusions. No suspicious appearing pulmonary nodules or masses. Upper Abdomen: Mild diffuse decreased attenuation throughout the hepatic parenchyma, compatible with hepatic steatosis. Musculoskeletal/Soft Tissues: There are no aggressive appearing lytic or blastic lesions noted in the visualized portions of the skeleton. Review of the MIP images confirms the above findings. IMPRESSION: 1. No acute findings in the thorax to account for the patient's symptoms. Specifically, no acute abnormality of the thoracic aorta. 2. Hepatic steatosis. Electronically Signed   By: Trudie Reed M.D.   On: 01/11/2016 19:29   I have personally reviewed and evaluated these images and lab results as part of my medical decision-making.   EKG Interpretation   Date/Time:  Wednesday January 11 2016 17:00:03 EST Ventricular Rate:  83 PR Interval:  180 QRS Duration: 95 QT Interval:  342 QTC Calculation: 402 R Axis:   -28 Text Interpretation:  Sinus or ectopic atrial rhythm Probable left atrial  enlargement Borderline left axis deviation Abnormal R-wave progression,  late transition Nonspecific T abnormalities, lateral leads Borderline ST  elevation, anterior leads Confirmed by Rubin Payor  MD, NATHAN [phone]) on  01/11/2016 5:07:38 PM      MDM   Final diagnoses:  Near syncope    Filed Vitals:   01/11/16 1549 01/11/16 1830 01/11/16 1924  BP: 139/84 119/75 98/57  Pulse: 93 157 82  Temp: 98.1 F (36.7 C)    TempSrc: Oral    Resp: SpO2: 96% 97% 98%     Medications  iohexol (OMNIPAQUE) 350 MG/ML injection 100 mL (100 mLs Intravenous Contrast Given 01/11/16 1855)    Fred Green is 40 y.o. male presenting with acute onset of severe intrascapular back pain, patient was lifting papers when it started. Patient had headache and was lightheaded and felt presyncopal. He did not pass out. He denies any anterior chest pain.  Discussed case with attending physician who agrees with care plan and disposition.    Patient's EKG with nonspecific changes from prior. Chest x-ray with no acute abnormalities, CT of chest to rule out dissection pending. Case signed out to [facility], will need admission for cardiac rule out.    Wynetta Emery, PA-C 01/11/16 2030  Arby Barrette, MD 01/11/16 [phone]

## 2016-01-11 NOTE — ED Notes (Signed)
MD at bedside. 

## 2016-01-11 NOTE — ED Notes (Signed)
Bed: OZ30 Expected date:  Expected time:  Means of arrival:  Comments: [id number]

## 2016-01-11 NOTE — ED Notes (Signed)
PT RETURNED FROM XRAY.

## 2016-01-11 NOTE — Progress Notes (Signed)
EDCM spoke to patient at bedside. Patient confirms he does not have a pcp or insurance living in [place].  [facility] provided patient with contact infromation to [facility], informed patient of services there.  EDCM also provided patient with list of pcps who accept self pay patients, list of discount pharmacies and websites needymeds.org and GoodRX.com for medication assistance, phone number to inquire about the orange card, phone number to inquire about Medicaid, phone number to inquire about the Affordable Care Act, financial resources in the community such as local churches, [employer], urban ministries, and dental assistance for uninsured patients.  Patient thankful for resources.  No further EDCM needs at this time.

## 2016-01-11 NOTE — ED Provider Notes (Signed)
This patient's care was assumed from [facility], PA-C at shift change. Please see her note for further. Briefly, this patient presented to the emergency department complaining of sudden onset of chest and upper back pain earlier today lasting approximately 15 minutes. On my evaluation patient has asymptomatic. He denies any chest pain. His chest x-ray shows mild cardiomegaly. EKG shows some nonspecific T abnormalities and some borderline ST elevation in lateral leads. No previous EKG to compare. Plan at shift change was to have a CT angiogram of the chest and admit for ACS rule out with EKG changes if CTA chest is normal.  CT angiogram chest is unremarkable. No evidence of abnormalities in the thoracic aorta.  I consulted with hospitalist Dr. Clyde Lundborg who accepted the patient for admission but would like a consult to cardiology to review the patient's EKG. Telemetry temporary admission orders place. I consulted with cardiologist Dr. Onalee Hua who evaluated the EKG and notes the borderline EKG changes. He advises as the patient is chest pain-free currently he is okay to stay in telemetry at [facility]. He reports that the patient has repeat chest pain to recheck an EKG and 50 has an elevated troponin to contact cardiology for likely cath and transfer to [facility]. I advised Dr. Clyde Lundborg of this and he agrees with plan.  The patient is in agreement with admission.   This patient was discussed with and evaluated by Dr. Donnald Garre who agrees with assessment and plan.   Results for orders placed or performed during the hospital encounter of 01/11/16  CBC with Differential  Result Value Ref Range   WBC 9.6 4.0 - 10.5 K/uL   RBC 5.08 4.22 - 5.81 MIL/uL   Hemoglobin 15.5 13.0 - 17.0 g/dL   HCT 16.1 09.6 - 04.5 %   MCV 87.8 78.0 - 100.0 fL   MCH 30.5 26.0 - 34.0 pg   MCHC 34.8 30.0 - 36.0 g/dL   RDW 40.9 81.1 - 91.4 %   Platelets 380 150 - 400 K/uL   Neutrophils Relative % 53 %   Neutro Abs 5.1 1.7 - 7.7 K/uL    Lymphocytes Relative 38 %   Lymphs Abs 3.7 0.7 - 4.0 K/uL   Monocytes Relative 6 %   Monocytes Absolute 0.5 0.1 - 1.0 K/uL   Eosinophils Relative 3 %   Eosinophils Absolute 0.3 0.0 - 0.7 K/uL   Basophils Relative 0 %   Basophils Absolute 0.0 0.0 - 0.1 K/uL  Comprehensive metabolic panel  Result Value Ref Range   Sodium 141 135 - 145 mmol/L   Potassium 4.0 3.5 - 5.1 mmol/L   Chloride 105 101 - 111 mmol/L   CO2 26 22 - 32 mmol/L   Glucose, Bld 107 (H) 65 - 99 mg/dL   BUN 12 6 - 20 mg/dL   Creatinine, Ser 7.82 0.61 - 1.24 mg/dL   Calcium 9.9 8.9 - 95.6 mg/dL   Total Protein 8.0 6.5 - 8.1 g/dL   Albumin 4.5 3.5 - 5.0 g/dL   AST 19 15 - 41 U/L   ALT 22 17 - 63 U/L   Alkaline Phosphatase 78 38 - 126 U/L   Total Bilirubin 0.7 0.3 - 1.2 mg/dL   GFR calc non Af Amer >60 >60 mL/min   GFR calc Af Amer >60 >60 mL/min   Anion gap 10 5 - 15  Protime-INR  Result Value Ref Range   Prothrombin Time 14.0 11.6 - 15.2 seconds   INR 1.06 0.00 - 1.49  I-Stat Chem 8, ED  Result Value Ref Range   Sodium 140 135 - 145 mmol/L   Potassium 3.7 3.5 - 5.1 mmol/L   Chloride 102 101 - 111 mmol/L   BUN 10 6 - 20 mg/dL   Creatinine, Ser 1.61 0.61 - 1.24 mg/dL   Glucose, Bld 096 (H) 65 - 99 mg/dL   Calcium, Ion 0.45 4.09 - 1.23 mmol/L   TCO2 25 0 - 100 mmol/L   Hemoglobin 16.3 13.0 - 17.0 g/dL   HCT 81.1 91.4 - 78.2 %  I-stat troponin, ED  Result Value Ref Range   Troponin i, poc 0.00 0.00 - 0.08 ng/mL   Comment 3           Dg Chest 2 View  01/11/2016  CLINICAL DATA:  Mild mid chest pain radiating to RIGHT upper back and LEFT upper back, smoker EXAM: CHEST  2 VIEW COMPARISON:  03/18/2008 FINDINGS: Minimal enlargement of cardiac silhouette. Slight pulmonary vascular congestion. Mediastinal contours normal. Lungs clear. No pleural effusion or pneumothorax. Bones unremarkable. IMPRESSION: Minimal enlargement of cardiac silhouette with pulmonary vascular congestion. No acute infiltrate. Electronically  Signed   By: Ulyses Southward M.D.   On: 01/11/2016 17:12   Ct Angio Chest Aorta W/cm &/or Wo/cm  01/11/2016  CLINICAL DATA:  40 year old male with history of sudden onset of sharp shooting chest pain radiating from the right upper back to the left upper back at 3 p.m. today while picking up a heavy box off the floor. Headache. Lightheadedness. EXAM: CT ANGIOGRAPHY CHEST WITH CONTRAST TECHNIQUE: Multidetector CT imaging of the chest was performed using the standard protocol during bolus administration of intravenous contrast. Multiplanar CT image reconstructions and MIPs were obtained to evaluate the vascular anatomy. CONTRAST:  OMNIPAQUE IOHEXOL 350 MG/ML SOLN COMPARISON:  No priors. FINDINGS: Mediastinum/Lymph Nodes: Noncontrast images demonstrate no crescentic high attenuation associated with the wall of the thoracic aorta to suggest acute intramural hemorrhage. No aneurysm or dissection identified in the thoracic aorta on post-contrast images. Heart size is normal. There is no significant pericardial fluid, thickening or pericardial calcification. No pathologically enlarged mediastinal or hilar lymph nodes. Esophagus is unremarkable in appearance. No axillary lymphadenopathy. Lungs/Pleura: No pneumothorax. No acute consolidative airspace disease. No pleural effusions. No suspicious appearing pulmonary nodules or masses. Upper Abdomen: Mild diffuse decreased attenuation throughout the hepatic parenchyma, compatible with hepatic steatosis. Musculoskeletal/Soft Tissues: There are no aggressive appearing lytic or blastic lesions noted in the visualized portions of the skeleton. Review of the MIP images confirms the above findings. IMPRESSION: 1. No acute findings in the thorax to account for the patient's symptoms. Specifically, no acute abnormality of the thoracic aorta. 2. Hepatic steatosis. Electronically Signed   By: Trudie Reed M.D.   On: 01/11/2016 19:29    Dx: Chest pain.   Abnormal EKG.    Everlene Farrier, PA-C 01/11/16 2053  Arby Barrette, MD 01/11/16 2100

## 2016-01-11 NOTE — H&P (Signed)
[facility] History and Physical  Lorry ARAN MENNING ZOX:[id number] DOB: Jul 27, 1976 DOA: 01/11/2016  Referring physician: ED physician PCP: No primary care provider on file.  Specialists:   Chief Complaint: Back pain  HPI: Fred Green is a 40 y.o. male with PMH of tobacco abuse, GERD, who presents with back pain.  Patient reports that he had back pain when he was moving heavy paper rolls at work at about 3 PM. His back pain started at right upper back, and then progressed to have acrossed his back, moved to his left upper back. The back pain lasted for a few seconds, then resolved spontaneously. Patient also had mild headache, which has also resolved. He denies chest pain, shortness of breath,  no cough, fever, chills, abdominal pain, diarrhea, symptoms of UTI or unilateral weakness. Patient does not have vision change, hearing loss, numbness or tingling sensations in his extremities. He reports that he had blood pressure checked on Monday which was required by his company. His Bp was 194/104 on Monday. He also had CXR done yesterday with pending results. He did not have history of hypertension or diabetes. Currently, pt does not have back pain or chest pain. He denies drug use.  In ED, patient was found to have negative troponin, WBC 9.6, INR 1.06, temperature normal, transient tachycardia with heart rate 157 which improved to 82, tachypnea with RR 25 which improved 20, blood pressure 98/57, electrolytes and renal function okay. Chest x-ray showed pulmonary vascular congestion. CT angiogram of aorta/chest showed no acute findings in the thorax to account for the patient'ssymptoms. Specifically, no acute abnormality of the thoracic aorta, hepatic steatosis.  EKG: Reviewed independently.  QTc 402, T wave inversion in lateral leads and V2 to V6, mild ST elevation in V3-V4, borderline RAD.    Where does patient live?   At home    Can patient participate in ADLs?  Yes    Review of Systems:    General: no fevers, chills, no changes in body weight, has fatigue HEENT: no blurry vision, hearing changes or sore throat. Had HA. Pulm: no dyspnea, coughing, wheezing CV: no chest pain, palpitations Abd: no nausea, vomiting, abdominal pain, diarrhea, constipation GU: no dysuria, burning on urination, increased urinary frequency, hematuria  Ext: no leg edema Neuro: no unilateral weakness, numbness, or tingling, no vision change or hearing loss. Had back pain. Skin: no rash MSK: No muscle spasm, no deformity, no limitation of range of movement in spin Heme: No easy bruising.  Travel history: No recent long distant travel.  Allergy: No Known Allergies  Past Medical History  Diagnosis Date  . GERD (gastroesophageal reflux disease)   . Tobacco abuse     History reviewed. No pertinent past surgical history.  Social History:  reports that he has been smoking Cigarettes.  He does not have any smokeless tobacco history on file. He reports that he does not drink alcohol. His drug history is not on file.  Family History:  Family History  Problem Relation Age of Onset  . Diabetes Mother   . Stroke Mother   . Cancer Mother   . Heart failure Father   . Heart attack Father      Prior to Admission medications   Medication Sig Start Date End Date Taking? Authorizing Provider  ibuprofen (ADVIL,MOTRIN) 200 MG tablet Take 400 mg by mouth every 6 (six) hours as needed (For tooth pain.).   Yes Historical Provider, MD  cephALEXin (KEFLEX) 500 MG capsule Take 2 capsules (1,000  mg total) by mouth 2 (two) times daily. Patient not taking: Reported on 02/14/2015 10/07/14   Fayrene Helper, PA-C  HYDROcodone-acetaminophen (NORCO/VICODIN) 5-325 MG per tablet Take 1-2 tablets by mouth every 4 (four) hours as needed. Patient not taking: Reported on 01/11/2016 02/14/15   Oswaldo Conroy, PA-C  oxyCODONE-acetaminophen (PERCOCET/ROXICET) 5-325 MG per tablet Take 1 tablet by mouth every 6 (six) hours as needed  for moderate pain or severe pain. Patient not taking: Reported on 02/14/2015 10/07/14   Fayrene Helper, PA-C    Physical Exam: Filed Vitals:   01/11/16 1549 01/11/16 1830 01/11/16 1924  BP: 139/84 119/75 98/57  Pulse: 93 157 82  Temp: 98.1 F (36.7 C)    TempSrc: Oral    Resp: SpO2: 96% 97% 98%   General: Not in acute distress HEENT:       Eyes: PERRL, EOMI, no scleral icterus.       ENT: No discharge from the ears and nose, no pharynx injection, no tonsillar enlargement.        Neck: No JVD, no bruit, no mass felt. Heme: No neck lymph node enlargement. Cardiac: S1/S2, RRR, No murmurs, No gallops or rubs. Pulm: No rales, wheezing, rhonchi or rubs. Abd: Soft, nondistended, nontender, no rebound pain, no organomegaly, BS present. Ext: No pitting leg edema bilaterally. 2+DP/PT pulse bilaterally. Musculoskeletal: No joint deformities, No joint redness or warmth, no limitation of ROM in spin. No tenderness over his back. Skin: No rashes.  Neuro: Alert, oriented X3, cranial nerves II-XII grossly intact, muscle strength 5/5 in all extremities, sensation to light touch intact. Knee reflex 1+ bilaterally. Negative Babinski's sign. Normal finger to nose test. Psych: Patient is not psychotic, no suicidal or hemocidal ideation.  Labs on Admission:  Basic Metabolic Panel:  Recent Labs Lab 01/11/16 1755 01/11/16 1802  NA 141 140  K 4.0 3.7  CL 105 102  CO2 26  --   GLUCOSE 107* 103*  BUN 12 10  CREATININE 0.92 0.90  CALCIUM 9.9  --    Liver Function Tests:  Recent Labs Lab 01/11/16 1755  AST 19  ALT 22  ALKPHOS 78  BILITOT 0.7  PROT 8.0  ALBUMIN 4.5   No results for input(s): LIPASE, AMYLASE in the last 168 hours. No results for input(s): AMMONIA in the last 168 hours. CBC:  Recent Labs Lab 01/11/16 1755 01/11/16 1802  WBC 9.6  --   NEUTROABS 5.1  --   HGB 15.5 16.3  HCT 44.6 48.0  MCV 87.8  --   PLT 380  --    Cardiac Enzymes: No results for input(s):  CKTOTAL, CKMB, CKMBINDEX, TROPONINI in the last 168 hours.  BNP (last 3 results) No results for input(s): BNP in the last 8760 hours.  ProBNP (last 3 results) No results for input(s): PROBNP in the last 8760 hours.  CBG: No results for input(s): GLUCAP in the last 168 hours.  Radiological Exams on Admission: Dg Chest 2 View  01/11/2016  CLINICAL DATA:  Mild mid chest pain radiating to RIGHT upper back and LEFT upper back, smoker EXAM: CHEST  2 VIEW COMPARISON:  03/18/2008 FINDINGS: Minimal enlargement of cardiac silhouette. Slight pulmonary vascular congestion. Mediastinal contours normal. Lungs clear. No pleural effusion or pneumothorax. Bones unremarkable. IMPRESSION: Minimal enlargement of cardiac silhouette with pulmonary vascular congestion. No acute infiltrate. Electronically Signed   By: Ulyses Southward M.D.   On: 01/11/2016 17:12   Ct Angio Chest Aorta W/cm &/or Wo/cm  01/11/2016  CLINICAL DATA:  40 year old male with history of sudden onset of sharp shooting chest pain radiating from the right upper back to the left upper back at 3 p.m. today while picking up a heavy box off the floor. Headache. Lightheadedness. EXAM: CT ANGIOGRAPHY CHEST WITH CONTRAST TECHNIQUE: Multidetector CT imaging of the chest was performed using the standard protocol during bolus administration of intravenous contrast. Multiplanar CT image reconstructions and MIPs were obtained to evaluate the vascular anatomy. CONTRAST:  OMNIPAQUE IOHEXOL 350 MG/ML SOLN COMPARISON:  No priors. FINDINGS: Mediastinum/Lymph Nodes: Noncontrast images demonstrate no crescentic high attenuation associated with the wall of the thoracic aorta to suggest acute intramural hemorrhage. No aneurysm or dissection identified in the thoracic aorta on post-contrast images. Heart size is normal. There is no significant pericardial fluid, thickening or pericardial calcification. No pathologically enlarged mediastinal or hilar lymph nodes. Esophagus  is unremarkable in appearance. No axillary lymphadenopathy. Lungs/Pleura: No pneumothorax. No acute consolidative airspace disease. No pleural effusions. No suspicious appearing pulmonary nodules or masses. Upper Abdomen: Mild diffuse decreased attenuation throughout the hepatic parenchyma, compatible with hepatic steatosis. Musculoskeletal/Soft Tissues: There are no aggressive appearing lytic or blastic lesions noted in the visualized portions of the skeleton. Review of the MIP images confirms the above findings. IMPRESSION: 1. No acute findings in the thorax to account for the patient's symptoms. Specifically, no acute abnormality of the thoracic aorta. 2. Hepatic steatosis. Electronically Signed   By: Trudie Reed M.D.   On: 01/11/2016 19:29    Assessment/Plan Principal Problem:   Abnormal EKG Active Problems:   Back pain   Tobacco abuse   GERD (gastroesophageal reflux disease)   Principal Problem:   Abnormal EKG Active Problems:   Back pain   Tobacco abuse   GERD (gastroesophageal reflux disease)  Back pain and abnormal EKG: Etiology is not clear for his back pain, but most likely due to muscle straining. CT-angiogram of aorta/chest is negative. Patient is found to have abnormal EKG, with T wave inversion in lateral leads and V2 to V6, mild ST elevation in V3-V4. EDP discussed with cardiologist on-call. Dr. Onalee Hua, who did not think this STEMI, particularly given no chest pain  - will admit to Tele bed  - cycle CE q6 x3 and repeat her EKG in the am  - pr Nitroglycerin, Morphine for check pain - aspirin, lipitor  - PRN ibuprofen and Tylenol for back pain - Risk factor stratification: will check FLP, UDS and A1C - 2d echo -check HIV Ab  GERD: - Protonix  Tobacco abuse: -Did counseling about importance of quitting smoking -Nicotine patch  DVT ppx: SQ Lovenox  Code Status: Full code Family Communication: None at bed side.   Disposition Plan: Admit to inpatient   Date  of Service 01/11/2016    Lorretta Harp [facility] Pager [phone]  If 7PM-7AM, please contact night-coverage [email or web address] Password [facility] 01/11/2016, 8:43 PM

## 2016-01-11 NOTE — ED Notes (Signed)
Pt complains of upper back pain that since 3PM today. Pt states he was at work putting away papers when the pain started. Pt states he also had a headache when the back pain started, but that his headache is now subsiding.

## 2016-01-12 ENCOUNTER — Observation Stay (HOSPITAL_BASED_OUTPATIENT_CLINIC_OR_DEPARTMENT_OTHER): Payer: Self-pay

## 2016-01-12 DIAGNOSIS — M5489 Other dorsalgia: Secondary | ICD-10-CM

## 2016-01-12 DIAGNOSIS — R9431 Abnormal electrocardiogram [ECG] [EKG]: Secondary | ICD-10-CM

## 2016-01-12 DIAGNOSIS — Z720 Tobacco use: Secondary | ICD-10-CM

## 2016-01-12 LAB — BASIC METABOLIC PANEL
Anion gap: 9 (ref 5–15)
BUN: 12 mg/dL (ref 6–20)
CALCIUM: 9.5 mg/dL (ref 8.9–10.3)
CO2: 25 mmol/L (ref 22–32)
Chloride: 106 mmol/L (ref 101–111)
Creatinine, Ser: 0.92 mg/dL (ref 0.61–1.24)
GFR calc Af Amer: >60 mL/min (ref >60)
GLUCOSE: 127 mg/dL — AB (ref 65–99)
Potassium: 4.2 mmol/L (ref 3.5–5.1)
Sodium: 140 mmol/L (ref 135–145)

## 2016-01-12 LAB — LIPID PANEL
CHOLESTEROL: 217 mg/dL — AB (ref 0–200)
HDL: 27 mg/dL — ABNORMAL LOW (ref >40)
LDL Cholesterol: 168 mg/dL — ABNORMAL HIGH (ref 0–99)
Total CHOL/HDL Ratio: 8 RATIO
Triglycerides: 112 mg/dL (ref <150)
VLDL: 22 mg/dL (ref 0–40)

## 2016-01-12 LAB — HEMOGLOBIN A1C
HEMOGLOBIN A1C: 7.4 % — AB (ref 4.8–5.6)
Mean Plasma Glucose: 166 mg/dL

## 2016-01-12 LAB — TROPONIN I

## 2016-01-12 NOTE — ED Notes (Signed)
Called ECHO to confirm that the Pt was on their list.  ECHO staff stated "I'll let them know."

## 2016-01-12 NOTE — Progress Notes (Signed)
Echocardiogram 2D Echocardiogram has been performed.  Fred Green 01/12/2016, 12:29 PM

## 2016-01-12 NOTE — ED Notes (Signed)
ECHO Tech, Fleet Contras, reports "it could be around 11, before I can get down there."  Tech had been waiting for the Pt to get an Inpatient bed.

## 2016-01-12 NOTE — ED Notes (Signed)
Per Dr. Deno Etienne, pt is clear for discharge.

## 2016-01-12 NOTE — Discharge Summary (Signed)
Physician Discharge Summary  Fred Green:[id number] DOB: February 27, 1976 DOA: 01/11/2016  PCP: No primary care provider on file.  Admit date: 01/11/2016 Discharge date: 01/12/2016  Time spent: 20 minutes  Recommendations for Outpatient Follow-up:  1. Follow up with PCP in 1-2 weeks   Discharge Diagnoses:  Principal Problem:   Abnormal EKG Active Problems:   Back pain   Tobacco abuse   GERD (gastroesophageal reflux disease)   Discharge Condition: Stable  Diet recommendation: Heart healthy  There were no vitals filed for this visit.  History of present illness:  Please review dictated H and P from 1/18 for details. Briefly, 40 y.o. male with PMH of tobacco abuse, GERD, who presented initially with back pain after heavy lifting objects at work. During work up, patient noted to have concerns of possible T wave changes. Patient was admitted for further work up.  Hospital Course:  Back pain and abnormal EKG:  - Most likely due to muscle straining given associated heavy lifting at work - CT-angiogram of aorta/chest is negative.  - Patient was found to have abnormal EKG, with T wave inversion in lateral leads and V2 to V6, mild ST elevation in V3-V4. EDP discussed with cardiologist on-call. Dr. Onalee Hua, who reviewed findings and did not believe this was true STEMI, particularly given no chest pain. On further questioning, patient reports lead falling off while attempting to obtain EKG, thus question lead misplacement - Troponin serially negative. 2d echo was done which showed no wall motion abnormality. Patient has remained completely asymptomatic with no chest pain or further back pain and has been very eager to go home today. Patient to follow up closely as outpatient on discharge  GERD: - Protonix while admitted  Tobacco abuse: -Did counseling about importance of quitting smoking -Nicotine patch while admitted  DVT ppx: SQ Lovenox while admitted  Consultations:  EDP discussed  case with Cardiology on call  Discharge Exam: Filed Vitals:   01/12/16 0800 01/12/16 1100 01/12/16 1126 01/12/16 1424  BP: 122/78  141/89 144/95  Pulse:  75 75 75  Temp:   97.6 F (36.4 C)   TempSrc:   Oral   Resp: SpO2:  100% 100% 100%    General: awake, in nad Cardiovascular: regular, s1, s2 Respiratory: normal resp effort, no wheezing  Discharge Instructions     Medication List    STOP taking these medications        cephALEXin 500 MG capsule  Commonly known as:  KEFLEX      TAKE these medications        HYDROcodone-acetaminophen 5-325 MG tablet  Commonly known as:  NORCO/VICODIN  Take 1-2 tablets by mouth every 4 (four) hours as needed.     ibuprofen 200 MG tablet  Commonly known as:  ADVIL,MOTRIN  Take 400 mg by mouth every 6 (six) hours as needed (For tooth pain.).     oxyCODONE-acetaminophen 5-325 MG tablet  Commonly known as:  PERCOCET/ROXICET  Take 1 tablet by mouth every 6 (six) hours as needed for moderate pain or severe pain.       No Known Allergies    The results of significant diagnostics from this hospitalization (including imaging, microbiology, ancillary and laboratory) are listed below for reference.    Significant Diagnostic Studies: Dg Chest 2 View  01/11/2016  CLINICAL DATA:  Mild mid chest pain radiating to RIGHT upper back and LEFT upper back, smoker EXAM: CHEST  2 VIEW COMPARISON:  03/18/2008 FINDINGS: Minimal  enlargement of cardiac silhouette. Slight pulmonary vascular congestion. Mediastinal contours normal. Lungs clear. No pleural effusion or pneumothorax. Bones unremarkable. IMPRESSION: Minimal enlargement of cardiac silhouette with pulmonary vascular congestion. No acute infiltrate. Electronically Signed   By: Ulyses Southward M.D.   On: 01/11/2016 17:12   Ct Angio Chest Aorta W/cm &/or Wo/cm  01/11/2016  CLINICAL DATA:  40 year old male with history of sudden onset of sharp shooting chest pain radiating from the right  upper back to the left upper back at 3 p.m. today while picking up a heavy box off the floor. Headache. Lightheadedness. EXAM: CT ANGIOGRAPHY CHEST WITH CONTRAST TECHNIQUE: Multidetector CT imaging of the chest was performed using the standard protocol during bolus administration of intravenous contrast. Multiplanar CT image reconstructions and MIPs were obtained to evaluate the vascular anatomy. CONTRAST:  OMNIPAQUE IOHEXOL 350 MG/ML SOLN COMPARISON:  No priors. FINDINGS: Mediastinum/Lymph Nodes: Noncontrast images demonstrate no crescentic high attenuation associated with the wall of the thoracic aorta to suggest acute intramural hemorrhage. No aneurysm or dissection identified in the thoracic aorta on post-contrast images. Heart size is normal. There is no significant pericardial fluid, thickening or pericardial calcification. No pathologically enlarged mediastinal or hilar lymph nodes. Esophagus is unremarkable in appearance. No axillary lymphadenopathy. Lungs/Pleura: No pneumothorax. No acute consolidative airspace disease. No pleural effusions. No suspicious appearing pulmonary nodules or masses. Upper Abdomen: Mild diffuse decreased attenuation throughout the hepatic parenchyma, compatible with hepatic steatosis. Musculoskeletal/Soft Tissues: There are no aggressive appearing lytic or blastic lesions noted in the visualized portions of the skeleton. Review of the MIP images confirms the above findings. IMPRESSION: 1. No acute findings in the thorax to account for the patient's symptoms. Specifically, no acute abnormality of the thoracic aorta. 2. Hepatic steatosis. Electronically Signed   By: Trudie Reed M.D.   On: 01/11/2016 19:29    Microbiology: No results found for this or any previous visit (from the past 240 hour(s)).   Labs: Basic Metabolic Panel:  Recent Labs Lab 01/11/16 1755 01/11/16 1802 01/12/16 0615  NA 141 140 140  K 4.0 3.7 4.2  CL 105 102 106  CO2 26  --  25   GLUCOSE 107* 103* 127*  BUN CREATININE 0.92 0.90 0.92  CALCIUM 9.9  --  9.5   Liver Function Tests:  Recent Labs Lab 01/11/16 1755  AST 19  ALT 22  ALKPHOS 78  BILITOT 0.7  PROT 8.0  ALBUMIN 4.5   No results for input(s): LIPASE, AMYLASE in the last 168 hours. No results for input(s): AMMONIA in the last 168 hours. CBC:  Recent Labs Lab 01/11/16 1755 01/11/16 1802  WBC 9.6  --   NEUTROABS 5.1  --   HGB 15.5 16.3  HCT 44.6 48.0  MCV 87.8  --   PLT 380  --    Cardiac Enzymes:  Recent Labs Lab 01/11/16 2046 01/12/16 0254 01/12/16 0823  TROPONINI <0.03 <0.03 <0.03   BNP: BNP (last 3 results) No results for input(s): BNP in the last 8760 hours.  ProBNP (last 3 results) No results for input(s): PROBNP in the last 8760 hours.  CBG: No results for input(s): GLUCAP in the last 168 hours.    Signed:  CHIU, Scheryl Marten  [facility] 01/12/2016, 2:25 PM

## 2016-01-12 NOTE — ED Notes (Signed)
Pt updated on admission status and informed that this RN would be calling ECHO to confirm that he is on their list.  Pt reports "I can't stay here another night.  I have to work and make money."  This RN educated the Pt that he would need to discussed that w/ the Hospitalist this morning.

## 2016-01-12 NOTE — ED Notes (Signed)
Per Hospitalist, Pt will be discharged is ECHO is normal.

## 2017-04-09 IMAGING — CR DG CHEST 2V
2 series · 2 of 2 positions shown · non-contrast
Comparison: 03/18/2008

CLINICAL DATA: Mild mid chest pain radiating to RIGHT upper back
and LEFT upper back, smoker

EXAM:
CHEST  2 VIEW

[w chest pa]
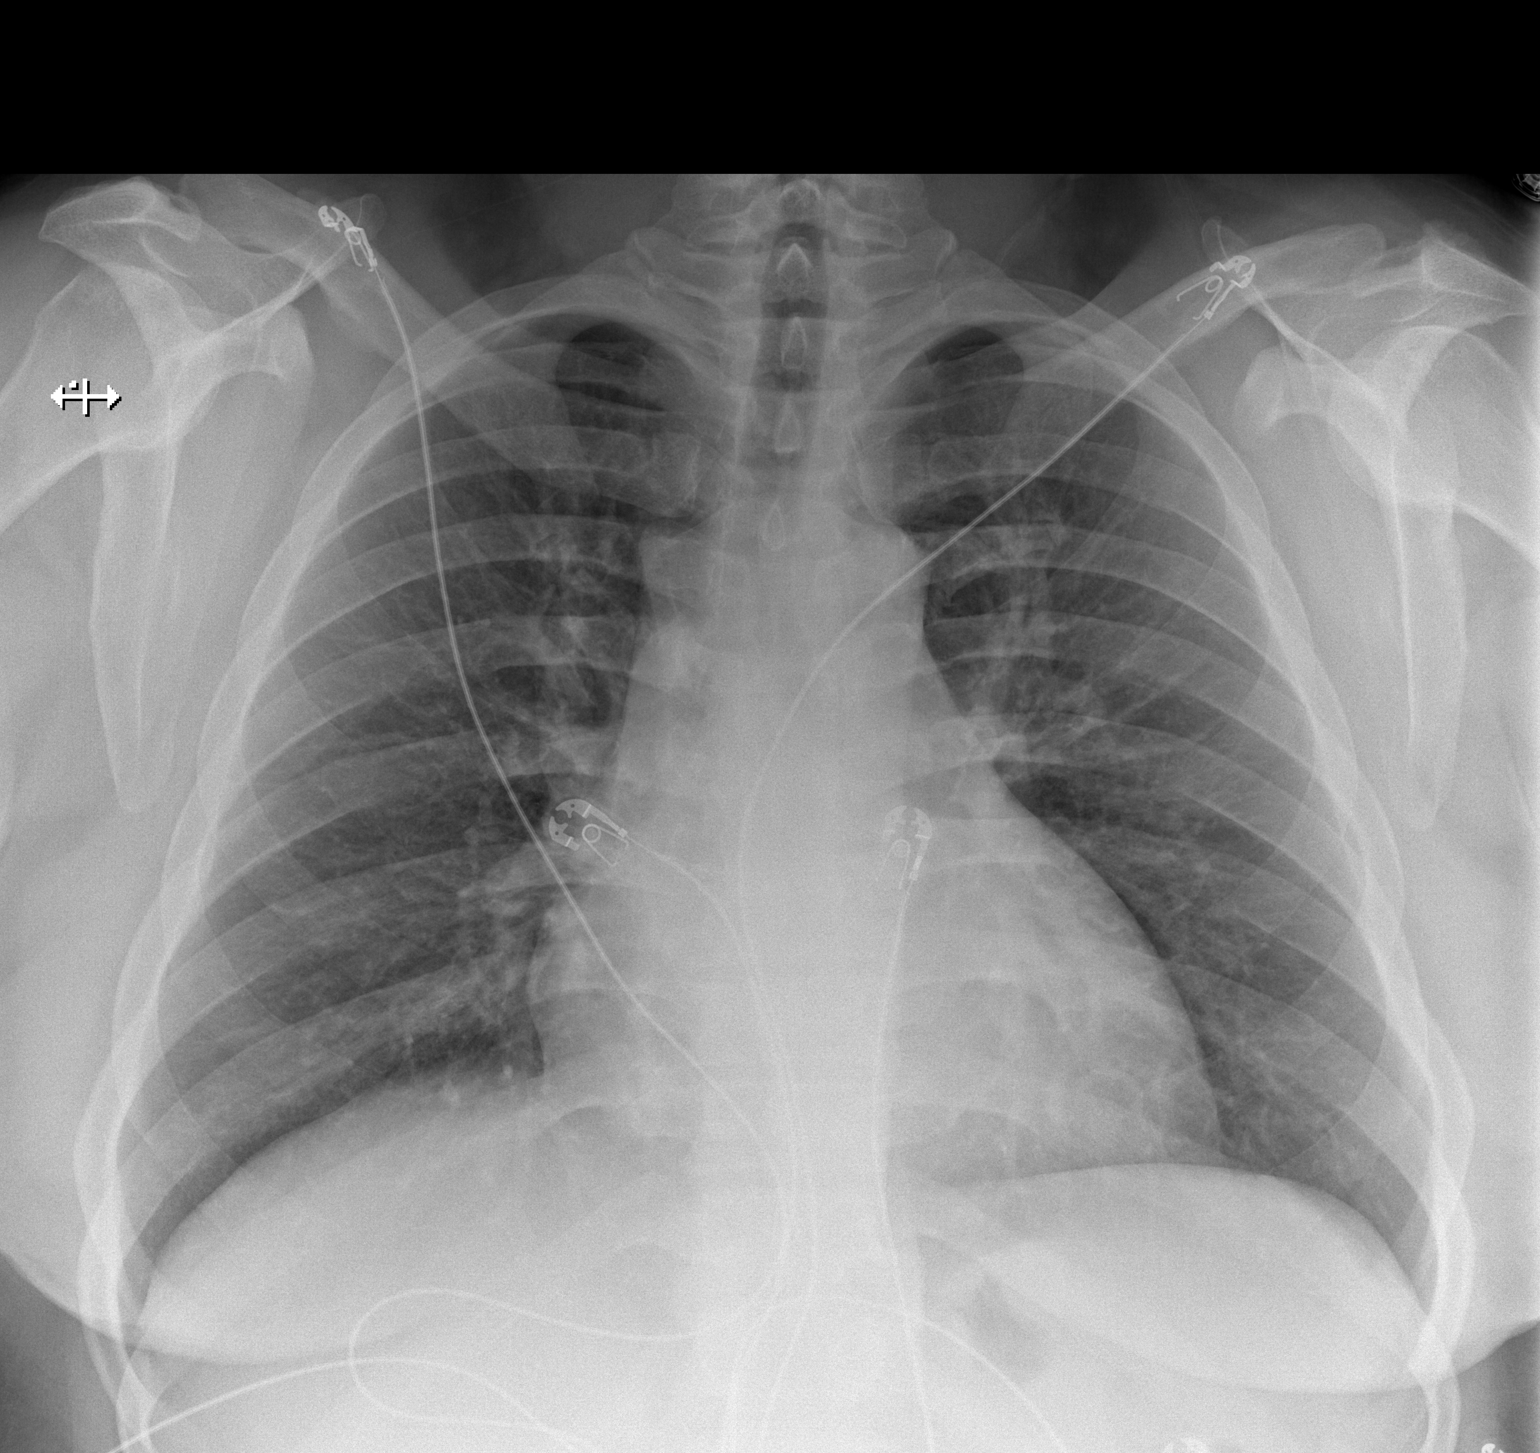

[w chest lat]
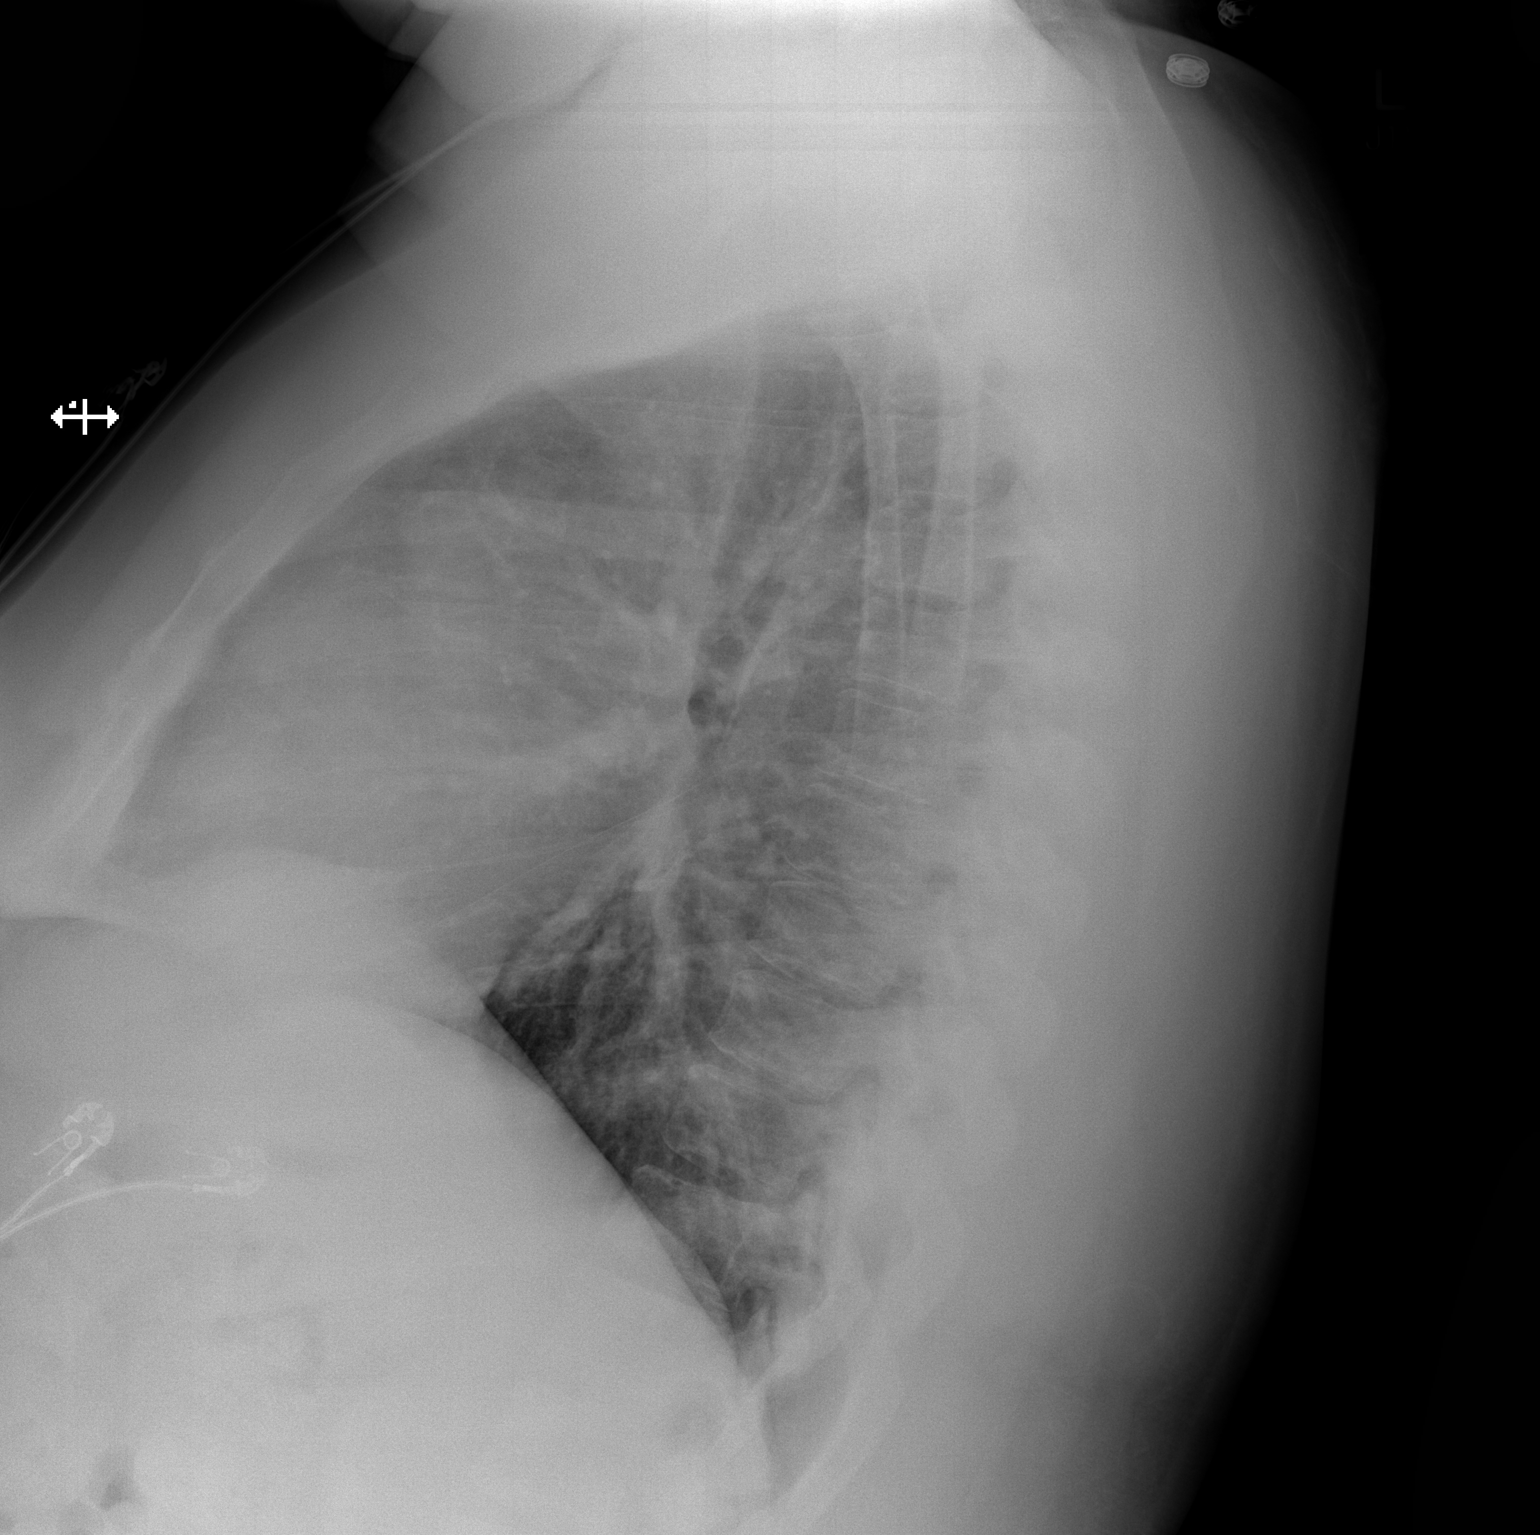

[2 of 2 positions shown; findings below may reference images not displayed]

FINDINGS: Minimal enlargement of cardiac silhouette.

Slight pulmonary vascular congestion.

Mediastinal contours normal.

Lungs clear.

No pleural effusion or pneumothorax.

Bones unremarkable.
IMPRESSION: Minimal enlargement of cardiac silhouette with pulmonary vascular
congestion.

No acute infiltrate.

## 2022-01-03 ENCOUNTER — Other Ambulatory Visit: Payer: Self-pay

## 2022-01-03 ENCOUNTER — Emergency Department (HOSPITAL_BASED_OUTPATIENT_CLINIC_OR_DEPARTMENT_OTHER)
Admission: EM | Admit: 2022-01-03 | Discharge: 2022-01-03 | Disposition: A | Discharge status: Home / Self Care | Payer: No Typology Code available for payment source | Attending: Emergency Medicine | Admitting: Emergency Medicine

## 2022-01-03 ENCOUNTER — Encounter (HOSPITAL_BASED_OUTPATIENT_CLINIC_OR_DEPARTMENT_OTHER): Payer: Self-pay | Admitting: Emergency Medicine

## 2022-01-03 DIAGNOSIS — M546 Pain in thoracic spine: Secondary | ICD-10-CM | POA: Diagnosis not present

## 2022-01-03 DIAGNOSIS — R9431 Abnormal electrocardiogram [ECG] [EKG]: Secondary | ICD-10-CM | POA: Insufficient documentation

## 2022-01-03 DIAGNOSIS — M549 Dorsalgia, unspecified: Secondary | ICD-10-CM | POA: Diagnosis not present

## 2022-01-03 DIAGNOSIS — R0789 Other chest pain: Secondary | ICD-10-CM | POA: Diagnosis not present

## 2022-01-03 MED ORDER — METHOCARBAMOL 500 MG PO TABS: 1000.0000 mg | ORAL_TABLET | Freq: Two times a day (BID) | ORAL | 0 refills | Status: DC

## 2022-01-03 MED ORDER — LIDOCAINE 5 % EX PTCH: 1.0000 | MEDICATED_PATCH | CUTANEOUS | 0 refills | Status: DC

## 2022-01-03 NOTE — ED Provider Notes (Signed)
[facility] EMERGENCY DEPT Provider Note   CSN: [id number] Arrival date & time: 01/03/22  1555     History  Chief Complaint  Patient presents with   Chest Pain    Fred Green is a 46 y.o. male.  46 year old male presents with 6 days of right posterior back pain.  Patient states he does lots of heavy lifting.  States that it is a sharp pain is worse with certain positions.  Does not radiate to his arm.  Does go through to his chest but not associated dyspnea or diaphoresis.  No CHF or anginal type symptoms.  Has been using Motrin without relief.      Home Medications Prior to Admission medications   Medication Sig Start Date End Date Taking? Authorizing Provider  HYDROcodone-acetaminophen (NORCO/VICODIN) 5-325 MG per tablet Take 1-2 tablets by mouth every 4 (four) hours as needed. Patient not taking: Reported on 01/11/2016 02/14/15   Al Corpus, PA-C  ibuprofen (ADVIL,MOTRIN) 200 MG tablet Take 400 mg by mouth every 6 (six) hours as needed (For tooth pain.).    [provider]  oxyCODONE-acetaminophen (PERCOCET/ROXICET) 5-325 MG per tablet Take 1 tablet by mouth every 6 (six) hours as needed for moderate pain or severe pain. Patient not taking: Reported on 02/14/2015 10/07/14   Domenic Moras, PA-C      Allergies    Patient has no known allergies.    Review of Systems   Review of Systems  All other systems reviewed and are negative.  Physical Exam Updated Vital Signs BP (!) 166/100    Pulse 98    Temp 98 F (36.7 C)    Resp 16    Ht 1.93 m (6\' 4" )    Wt (!) 170.1 kg    SpO2 100%    BMI 45.65 kg/m  Physical Exam Vitals and nursing note reviewed.  Constitutional:      General: He is not in acute distress.    Appearance: Normal appearance. He is well-developed. He is not toxic-appearing.  HENT:     Head: Normocephalic and atraumatic.  Eyes:     General: Lids are normal.     Conjunctiva/sclera: Conjunctivae normal.     Pupils: Pupils are  equal, round, and reactive to light.  Neck:     Thyroid: No thyroid mass.     Trachea: No tracheal deviation.  Cardiovascular:     Rate and Rhythm: Normal rate and regular rhythm.     Heart sounds: Normal heart sounds. No murmur heard.   No gallop.  Pulmonary:     Effort: Pulmonary effort is normal. No respiratory distress.     Breath sounds: Normal breath sounds. No stridor. No decreased breath sounds, wheezing, rhonchi or rales.  Abdominal:     General: There is no distension.     Palpations: Abdomen is soft.     Tenderness: There is no abdominal tenderness. There is no rebound.  Musculoskeletal:        General: No tenderness. Normal range of motion.     Cervical back: Normal range of motion and neck supple.       Back:  Skin:    General: Skin is warm and dry.     Findings: No abrasion or rash.  Neurological:     Mental Status: He is alert and oriented to person, place, and time. Mental status is at baseline.     GCS: GCS eye subscore is 4. GCS verbal subscore is 5. GCS motor subscore is  6.     Cranial Nerves: No cranial nerve deficit.     Sensory: No sensory deficit.     Motor: Motor function is intact.  Psychiatric:        Attention and Perception: Attention normal.        Speech: Speech normal.        Behavior: Behavior normal.    ED Results / Procedures / Treatments   Labs (all labs ordered are listed, but only abnormal results are displayed) Labs Reviewed - No data to display  EKG EKG Interpretation  Date/Time:  Wednesday January 03 2022 16:03:14 EST Ventricular Rate:  98 PR Interval:  170 QRS Duration: 104 QT Interval:  334 QTC Calculation: 426 R Axis:   106 Text Interpretation: Normal sinus rhythm Rightward axis ST & T wave abnormality, consider inferior ischemia Abnormal ECG When compared with ECG of 11-Jan-2016 17:00, PREVIOUS ECG IS PRESENT No significant change since last tracing Confirmed by Lacretia Leigh ([id number]) on 01/03/2022 4:30:23  PM  Radiology No results found.  Procedures Procedures    Medications Ordered in ED Medications - No data to display  ED Course/ Medical Decision Making/ A&P                           Medical Decision Making Patient is EKG is unchanged from prior.  He has pinpoint tenderness below his right scapula.  No suspicion for dissection or pneumothorax or cardiac etiology.  Suspect musculoskeletal etiology.  Will place on lidocaine patches as well as give muscle axis return precautions.  Discussed with patient and his significant other who are comfortable with this           Final Clinical Impression(s) / ED Diagnoses Final diagnoses:  None    Rx / DC Orders ED Discharge Orders     None         Lacretia Leigh, MD 01/03/22 1641

## 2022-01-03 NOTE — ED Triage Notes (Addendum)
Cp that started in his back since Thursday, pain comes and goes some sob  this am hurts when he moves

## 2024-01-20 DIAGNOSIS — Z79899 Other long term (current) drug therapy: Secondary | ICD-10-CM | POA: Diagnosis not present

## 2024-01-20 DIAGNOSIS — M10079 Idiopathic gout, unspecified ankle and foot: Secondary | ICD-10-CM | POA: Diagnosis not present

## 2024-01-26 ENCOUNTER — Inpatient Hospital Stay (HOSPITAL_COMMUNITY): Payer: Medicaid Other

## 2024-01-26 ENCOUNTER — Emergency Department (HOSPITAL_COMMUNITY): Payer: Medicaid Other | Admitting: Anesthesiology

## 2024-01-26 ENCOUNTER — Encounter (HOSPITAL_COMMUNITY): Payer: Self-pay

## 2024-01-26 ENCOUNTER — Encounter (HOSPITAL_COMMUNITY)
Admission: EM | Disposition: A | Discharge status: Home Health | Payer: Self-pay | Source: Ambulatory Visit | Attending: Vascular Surgery

## 2024-01-26 ENCOUNTER — Other Ambulatory Visit: Payer: Self-pay

## 2024-01-26 ENCOUNTER — Inpatient Hospital Stay (HOSPITAL_COMMUNITY)
Admission: EM | Admit: 2024-01-26 | Discharge: 2024-01-31 | DRG: 253 | Disposition: A | Discharge status: Home Health | Payer: Medicaid Other | Source: Ambulatory Visit | Attending: Vascular Surgery | Admitting: Vascular Surgery

## 2024-01-26 ENCOUNTER — Emergency Department (HOSPITAL_COMMUNITY): Payer: Medicaid Other

## 2024-01-26 ENCOUNTER — Ambulatory Visit (HOSPITAL_COMMUNITY)
Admission: EM | Admit: 2024-01-26 | Discharge: 2024-01-26 | Disposition: A | Discharge status: Home / Self Care | Payer: Self-pay

## 2024-01-26 DIAGNOSIS — Z79899 Other long term (current) drug therapy: Secondary | ICD-10-CM

## 2024-01-26 DIAGNOSIS — E66813 Obesity, class 3: Secondary | ICD-10-CM | POA: Diagnosis present

## 2024-01-26 DIAGNOSIS — Z823 Family history of stroke: Secondary | ICD-10-CM

## 2024-01-26 DIAGNOSIS — F17210 Nicotine dependence, cigarettes, uncomplicated: Secondary | ICD-10-CM | POA: Diagnosis present

## 2024-01-26 DIAGNOSIS — I743 Embolism and thrombosis of arteries of the lower extremities: Secondary | ICD-10-CM | POA: Diagnosis present

## 2024-01-26 DIAGNOSIS — M109 Gout, unspecified: Secondary | ICD-10-CM | POA: Diagnosis present

## 2024-01-26 DIAGNOSIS — K219 Gastro-esophageal reflux disease without esophagitis: Secondary | ICD-10-CM | POA: Diagnosis present

## 2024-01-26 DIAGNOSIS — I999 Unspecified disorder of circulatory system: Secondary | ICD-10-CM | POA: Diagnosis present

## 2024-01-26 DIAGNOSIS — I70221 Atherosclerosis of native arteries of extremities with rest pain, right leg: Secondary | ICD-10-CM | POA: Diagnosis present

## 2024-01-26 DIAGNOSIS — Z7952 Long term (current) use of systemic steroids: Secondary | ICD-10-CM | POA: Diagnosis not present

## 2024-01-26 DIAGNOSIS — I998 Other disorder of circulatory system: Secondary | ICD-10-CM | POA: Diagnosis not present

## 2024-01-26 DIAGNOSIS — E119 Type 2 diabetes mellitus without complications: Secondary | ICD-10-CM

## 2024-01-26 DIAGNOSIS — Z8249 Family history of ischemic heart disease and other diseases of the circulatory system: Secondary | ICD-10-CM | POA: Diagnosis not present

## 2024-01-26 DIAGNOSIS — E1151 Type 2 diabetes mellitus with diabetic peripheral angiopathy without gangrene: Secondary | ICD-10-CM | POA: Diagnosis present

## 2024-01-26 DIAGNOSIS — Z6841 Body Mass Index (BMI) 40.0 and over, adult: Secondary | ICD-10-CM | POA: Diagnosis not present

## 2024-01-26 DIAGNOSIS — Z5971 Insufficient health insurance coverage: Secondary | ICD-10-CM | POA: Diagnosis not present

## 2024-01-26 DIAGNOSIS — M79671 Pain in right foot: Secondary | ICD-10-CM

## 2024-01-26 DIAGNOSIS — Z7984 Long term (current) use of oral hypoglycemic drugs: Secondary | ICD-10-CM | POA: Diagnosis not present

## 2024-01-26 DIAGNOSIS — Z833 Family history of diabetes mellitus: Secondary | ICD-10-CM

## 2024-01-26 DIAGNOSIS — I739 Peripheral vascular disease, unspecified: Secondary | ICD-10-CM | POA: Diagnosis not present

## 2024-01-26 HISTORY — PX: FASCIOTOMY: SHX132

## 2024-01-26 HISTORY — DX: Type 2 diabetes mellitus without complications: E11.9

## 2024-01-26 HISTORY — PX: INSERTION OF ILIAC STENT: SHX6256

## 2024-01-26 HISTORY — PX: PATCH ANGIOPLASTY: SHX6230

## 2024-01-26 HISTORY — DX: Gout, unspecified: M10.9

## 2024-01-26 HISTORY — PX: THROMBECTOMY OF BYPASS GRAFT FEMORAL- POPLITEAL ARTERY: SHX6902

## 2024-01-26 HISTORY — PX: LOWER EXTREMITY ANGIOGRAM: SHX5508

## 2024-01-26 LAB — CBC WITH DIFFERENTIAL/PLATELET
Abs Immature Granulocytes: 0.07 10*3/uL (ref 0.00–0.07)
Basophils Absolute: 0.1 10*3/uL (ref 0.0–0.1)
Basophils Relative: 1 %
Eosinophils Absolute: 0.1 10*3/uL (ref 0.0–0.5)
Eosinophils Relative: 1 %
HCT: 50.6 % (ref 39.0–52.0)
Hemoglobin: 17.2 g/dL — ABNORMAL HIGH (ref 13.0–17.0)
Immature Granulocytes: 1 %
Lymphocytes Relative: 19 %
Lymphs Abs: 2.6 10*3/uL (ref 0.7–4.0)
MCH: 29.8 pg (ref 26.0–34.0)
MCHC: 34 g/dL (ref 30.0–36.0)
MCV: 87.7 fL (ref 80.0–100.0)
Monocytes Absolute: 0.9 10*3/uL (ref 0.1–1.0)
Monocytes Relative: 7 %
Neutro Abs: 9.8 10*3/uL — ABNORMAL HIGH (ref 1.7–7.7)
Neutrophils Relative %: 71 %
Platelets: 385 10*3/uL (ref 150–400)
RBC: 5.77 MIL/uL (ref 4.22–5.81)
RDW: 13 % (ref 11.5–15.5)
WBC: 13.5 10*3/uL — ABNORMAL HIGH (ref 4.0–10.5)
nRBC: 0 % (ref 0.0–0.2)

## 2024-01-26 LAB — PROTIME-INR
INR: 1 (ref 0.8–1.2)
Prothrombin Time: 13 s (ref 11.4–15.2)

## 2024-01-26 LAB — COMPREHENSIVE METABOLIC PANEL
ALT: 20 U/L (ref 0–44)
AST: 24 U/L (ref 15–41)
Albumin: 4.2 g/dL (ref 3.5–5.0)
Alkaline Phosphatase: 89 U/L (ref 38–126)
Anion gap: 12 (ref 5–15)
BUN: 22 mg/dL — ABNORMAL HIGH (ref 6–20)
CO2: 25 mmol/L (ref 22–32)
Calcium: 9.3 mg/dL (ref 8.9–10.3)
Chloride: 95 mmol/L — ABNORMAL LOW (ref 98–111)
Creatinine, Ser: 1.1 mg/dL (ref 0.61–1.24)
GFR, Estimated: >60 mL/min (ref >60)
Glucose, Bld: 326 mg/dL — ABNORMAL HIGH (ref 70–99)
Potassium: 5.4 mmol/L — ABNORMAL HIGH (ref 3.5–5.1)
Sodium: 132 mmol/L — ABNORMAL LOW (ref 135–145)
Total Bilirubin: 1.4 mg/dL — ABNORMAL HIGH (ref 0.0–1.2)
Total Protein: 7.6 g/dL (ref 6.5–8.1)

## 2024-01-26 LAB — CBC
HCT: 41.7 % (ref 39.0–52.0)
Hemoglobin: 14 g/dL (ref 13.0–17.0)
MCH: 30.2 pg (ref 26.0–34.0)
MCHC: 33.6 g/dL (ref 30.0–36.0)
MCV: 89.9 fL (ref 80.0–100.0)
Platelets: 319 10*3/uL (ref 150–400)
RBC: 4.64 MIL/uL (ref 4.22–5.81)
RDW: 12.9 % (ref 11.5–15.5)
WBC: 20.4 10*3/uL — ABNORMAL HIGH (ref 4.0–10.5)
nRBC: 0 % (ref 0.0–0.2)

## 2024-01-26 LAB — TYPE AND SCREEN
ABO/RH(D): AB POS
Antibody Screen: NEGATIVE

## 2024-01-26 LAB — POCT I-STAT, CHEM 8
BUN: 30 mg/dL — ABNORMAL HIGH (ref 6–20)
Calcium, Ion: 0.75 mmol/L — CL (ref 1.15–1.40)
Chloride: 103 mmol/L (ref 98–111)
Creatinine, Ser: 1 mg/dL (ref 0.61–1.24)
Glucose, Bld: 275 mg/dL — ABNORMAL HIGH (ref 70–99)
HCT: 53 % — ABNORMAL HIGH (ref 39.0–52.0)
Hemoglobin: 18 g/dL — ABNORMAL HIGH (ref 13.0–17.0)
Potassium: 7.7 mmol/L (ref 3.5–5.1)
Sodium: 128 mmol/L — ABNORMAL LOW (ref 135–145)
TCO2: 23 mmol/L (ref 22–32)

## 2024-01-26 LAB — ABO/RH: ABO/RH(D): AB POS

## 2024-01-26 LAB — SURGICAL PCR SCREEN
MRSA, PCR: NEGATIVE
Staphylococcus aureus: NEGATIVE

## 2024-01-26 LAB — APTT: aPTT: 27 s (ref 24–36)

## 2024-01-26 LAB — HEMOGLOBIN A1C
Hgb A1c MFr Bld: 10.1 % — ABNORMAL HIGH (ref 4.8–5.6)
Mean Plasma Glucose: 243.17 mg/dL

## 2024-01-26 LAB — POCT ACTIVATED CLOTTING TIME
Activated Clotting Time: 216 s
Activated Clotting Time: 210 s
Activated Clotting Time: 262 s
Activated Clotting Time: 291 s

## 2024-01-26 LAB — GLUCOSE, CAPILLARY
Glucose-Capillary: 255 mg/dL — ABNORMAL HIGH (ref 70–99)
Glucose-Capillary: 264 mg/dL — ABNORMAL HIGH (ref 70–99)

## 2024-01-26 LAB — HEPARIN LEVEL (UNFRACTIONATED): Heparin Unfractionated: >1.1 [IU]/mL — ABNORMAL HIGH (ref 0.30–0.70)

## 2024-01-26 SURGERY — THROMBECTOMY OF BYPASS GRAFT FEMORAL-POPLITEAL ARTERY
Anesthesia: General | Site: Leg Lower | Laterality: Right

## 2024-01-26 MED ORDER — FENTANYL CITRATE (PF) 250 MCG/5ML IJ SOLN
INTRAMUSCULAR | Status: DC | PRN
  Administered 2024-01-26 (×2): 50 ug via INTRAVENOUS
  Administered 2024-01-26: 150 ug via INTRAVENOUS

## 2024-01-26 MED ORDER — HEPARIN 6000 UNIT IRRIGATION SOLUTION
Status: AC
  Filled 2024-01-26: qty 500

## 2024-01-26 MED ORDER — CHLORHEXIDINE GLUCONATE 0.12 % MT SOLN: 15.0000 mL | Freq: Once | OROMUCOSAL | Status: AC

## 2024-01-26 MED ORDER — ACETAMINOPHEN 500 MG PO TABS: 1000.0000 mg | ORAL_TABLET | Freq: Once | ORAL | Status: DC | PRN

## 2024-01-26 MED ORDER — ONDANSETRON HCL 4 MG/2ML IJ SOLN: 4.0000 mg | Freq: Four times a day (QID) | INTRAMUSCULAR | Status: DC | PRN

## 2024-01-26 MED ORDER — ACETAMINOPHEN 10 MG/ML IV SOLN
INTRAVENOUS | Status: AC
  Filled 2024-01-26: qty 100

## 2024-01-26 MED ORDER — METFORMIN HCL 500 MG PO TABS
500.0000 mg | ORAL_TABLET | Freq: Two times a day (BID) | ORAL | Status: DC
  Administered 2024-01-29 – 2024-01-31 (×5): 500 mg via ORAL
  Filled 2024-01-26 (×5): qty 1

## 2024-01-26 MED ORDER — DEXAMETHASONE SODIUM PHOSPHATE 10 MG/ML IJ SOLN
INTRAMUSCULAR | Status: DC | PRN
  Administered 2024-01-26: 5 mg via INTRAVENOUS

## 2024-01-26 MED ORDER — FENTANYL CITRATE (PF) 100 MCG/2ML IJ SOLN
INTRAMUSCULAR | Status: AC
  Filled 2024-01-26: qty 2

## 2024-01-26 MED ORDER — HYDROMORPHONE HCL 1 MG/ML IJ SOLN
0.5000 mg | INTRAMUSCULAR | Status: DC | PRN
Start: 2024-01-26 — End: 2024-01-31
  Administered 2024-01-26 – 2024-01-30 (×9): 1 mg via INTRAVENOUS
  Filled 2024-01-26 (×10): qty 1

## 2024-01-26 MED ORDER — ONDANSETRON HCL 4 MG/2ML IJ SOLN
INTRAMUSCULAR | Status: AC
  Filled 2024-01-26: qty 2

## 2024-01-26 MED ORDER — PHENYLEPHRINE 80 MCG/ML (10ML) SYRINGE FOR IV PUSH (FOR BLOOD PRESSURE SUPPORT)
PREFILLED_SYRINGE | INTRAVENOUS | Status: AC
  Filled 2024-01-26: qty 10

## 2024-01-26 MED ORDER — PHENOL 1.4 % MT LIQD: 1.0000 | OROMUCOSAL | Status: DC | PRN

## 2024-01-26 MED ORDER — CEFAZOLIN SODIUM 1 G IJ SOLR
INTRAMUSCULAR | Status: AC
  Filled 2024-01-26: qty 30

## 2024-01-26 MED ORDER — HEPARIN SODIUM (PORCINE) 1000 UNIT/ML IJ SOLN
INTRAMUSCULAR | Status: AC
  Filled 2024-01-26: qty 20

## 2024-01-26 MED ORDER — ALUM & MAG HYDROXIDE-SIMETH 200-200-20 MG/5ML PO SUSP
15.0000 mL | ORAL | Status: DC | PRN
Start: 2024-01-26 — End: 2024-01-31

## 2024-01-26 MED ORDER — ACETAMINOPHEN 650 MG RE SUPP: 325.0000 mg | RECTAL | Status: DC | PRN

## 2024-01-26 MED ORDER — CLOPIDOGREL BISULFATE 75 MG PO TABS
75.0000 mg | ORAL_TABLET | Freq: Every day | ORAL | Status: DC
  Administered 2024-01-27 – 2024-01-31 (×5): 75 mg via ORAL
  Filled 2024-01-26 (×5): qty 1

## 2024-01-26 MED ORDER — OXYCODONE HCL 5 MG/5ML PO SOLN: 5.0000 mg | Freq: Once | ORAL | Status: DC | PRN

## 2024-01-26 MED ORDER — PROPOFOL 10 MG/ML IV BOLUS
INTRAVENOUS | Status: DC | PRN
  Administered 2024-01-26: 200 mg via INTRAVENOUS

## 2024-01-26 MED ORDER — SUCCINYLCHOLINE CHLORIDE 200 MG/10ML IV SOSY
PREFILLED_SYRINGE | INTRAVENOUS | Status: DC | PRN
  Administered 2024-01-26: 160 mg via INTRAVENOUS

## 2024-01-26 MED ORDER — LABETALOL HCL 5 MG/ML IV SOLN: 10.0000 mg | INTRAVENOUS | Status: DC | PRN

## 2024-01-26 MED ORDER — IOHEXOL 350 MG/ML SOLN
100.0000 mL | Freq: Once | INTRAVENOUS | Status: AC | PRN
  Administered 2024-01-26: 100 mL via INTRAVENOUS

## 2024-01-26 MED ORDER — SUCCINYLCHOLINE CHLORIDE 200 MG/10ML IV SOSY
PREFILLED_SYRINGE | INTRAVENOUS | Status: AC
  Filled 2024-01-26: qty 10

## 2024-01-26 MED ORDER — MIDAZOLAM HCL 2 MG/2ML IJ SOLN
INTRAMUSCULAR | Status: DC | PRN
  Administered 2024-01-26: 2 mg via INTRAVENOUS

## 2024-01-26 MED ORDER — PROPOFOL 10 MG/ML IV BOLUS
INTRAVENOUS | Status: AC
  Filled 2024-01-26: qty 20

## 2024-01-26 MED ORDER — LACTATED RINGERS IV SOLN
INTRAVENOUS | Status: DC
Start: 2024-01-26 — End: 2024-01-26

## 2024-01-26 MED ORDER — PREDNISONE 5 MG PO TABS
5.0000 mg | ORAL_TABLET | Freq: Every day | ORAL | Status: DC
  Administered 2024-01-27 – 2024-01-31 (×5): 5 mg via ORAL
  Filled 2024-01-26 (×5): qty 1

## 2024-01-26 MED ORDER — FENTANYL CITRATE (PF) 250 MCG/5ML IJ SOLN
INTRAMUSCULAR | Status: AC
  Filled 2024-01-26: qty 5

## 2024-01-26 MED ORDER — HEPARIN SODIUM (PORCINE) 1000 UNIT/ML IJ SOLN
INTRAMUSCULAR | Status: AC
  Filled 2024-01-26: qty 10

## 2024-01-26 MED ORDER — HYDRALAZINE HCL 20 MG/ML IJ SOLN: 5.0000 mg | INTRAMUSCULAR | Status: DC | PRN

## 2024-01-26 MED ORDER — HEPARIN BOLUS VIA INFUSION
6500.0000 [IU] | Freq: Once | INTRAVENOUS | Status: AC
  Administered 2024-01-26: 6500 [IU] via INTRAVENOUS
  Filled 2024-01-26: qty 6500

## 2024-01-26 MED ORDER — LACTATED RINGERS IV SOLN: INTRAVENOUS | Status: DC | PRN

## 2024-01-26 MED ORDER — OXYCODONE HCL 5 MG PO TABS: 5.0000 mg | ORAL_TABLET | Freq: Once | ORAL | Status: DC | PRN

## 2024-01-26 MED ORDER — HEMOSTATIC AGENTS (NO CHARGE) OPTIME
TOPICAL | Status: DC | PRN
  Administered 2024-01-26: 1 via TOPICAL

## 2024-01-26 MED ORDER — SENNOSIDES-DOCUSATE SODIUM 8.6-50 MG PO TABS: 1.0000 | ORAL_TABLET | Freq: Every evening | ORAL | Status: DC | PRN

## 2024-01-26 MED ORDER — SUGAMMADEX SODIUM 200 MG/2ML IV SOLN
INTRAVENOUS | Status: DC | PRN
  Administered 2024-01-26 (×2): 100 mg via INTRAVENOUS

## 2024-01-26 MED ORDER — MAGNESIUM SULFATE 2 GM/50ML IV SOLN: 2.0000 g | Freq: Every day | INTRAVENOUS | Status: DC | PRN

## 2024-01-26 MED ORDER — OXYCODONE-ACETAMINOPHEN 5-325 MG PO TABS
1.0000 | ORAL_TABLET | ORAL | Status: DC | PRN
  Administered 2024-01-27 (×2): 2 via ORAL
  Administered 2024-01-27: 1 via ORAL
  Administered 2024-01-28 (×2): 2 via ORAL
  Administered 2024-01-28: 1 via ORAL
  Administered 2024-01-29 – 2024-01-30 (×5): 2 via ORAL
  Filled 2024-01-26 (×7): qty 2
  Filled 2024-01-26: qty 1
  Filled 2024-01-26 (×3): qty 2

## 2024-01-26 MED ORDER — PHENYLEPHRINE 80 MCG/ML (10ML) SYRINGE FOR IV PUSH (FOR BLOOD PRESSURE SUPPORT)
PREFILLED_SYRINGE | INTRAVENOUS | Status: DC | PRN
  Administered 2024-01-26: 80 ug via INTRAVENOUS

## 2024-01-26 MED ORDER — METOPROLOL TARTRATE 5 MG/5ML IV SOLN
2.0000 mg | INTRAVENOUS | Status: DC | PRN
Start: 2024-01-26 — End: 2024-01-31

## 2024-01-26 MED ORDER — ACETAMINOPHEN 10 MG/ML IV SOLN
1000.0000 mg | Freq: Once | INTRAVENOUS | Status: DC | PRN
  Administered 2024-01-26: 1000 mg via INTRAVENOUS

## 2024-01-26 MED ORDER — PANTOPRAZOLE SODIUM 40 MG PO TBEC
40.0000 mg | DELAYED_RELEASE_TABLET | Freq: Every day | ORAL | Status: DC
  Administered 2024-01-27 – 2024-01-31 (×5): 40 mg via ORAL
  Filled 2024-01-26 (×5): qty 1

## 2024-01-26 MED ORDER — SODIUM CHLORIDE 0.9 % IV SOLN: 500.0000 mL | Freq: Once | INTRAVENOUS | Status: DC | PRN

## 2024-01-26 MED ORDER — ACETAMINOPHEN 325 MG PO TABS: 325.0000 mg | ORAL_TABLET | ORAL | Status: DC | PRN

## 2024-01-26 MED ORDER — DEXAMETHASONE SODIUM PHOSPHATE 10 MG/ML IJ SOLN
INTRAMUSCULAR | Status: AC
  Filled 2024-01-26: qty 1

## 2024-01-26 MED ORDER — CHLORHEXIDINE GLUCONATE 0.12 % MT SOLN
OROMUCOSAL | Status: AC
  Administered 2024-01-26: 15 mL via OROMUCOSAL
  Filled 2024-01-26: qty 15

## 2024-01-26 MED ORDER — ROCURONIUM BROMIDE 10 MG/ML (PF) SYRINGE
PREFILLED_SYRINGE | INTRAVENOUS | Status: AC
  Filled 2024-01-26: qty 10

## 2024-01-26 MED ORDER — INSULIN ASPART 100 UNIT/ML IJ SOLN
0.0000 [IU] | Freq: Three times a day (TID) | INTRAMUSCULAR | Status: DC
  Administered 2024-01-27: 3 [IU] via SUBCUTANEOUS
  Administered 2024-01-27: 4 [IU] via SUBCUTANEOUS
  Administered 2024-01-27: 7 [IU] via SUBCUTANEOUS
  Administered 2024-01-28 – 2024-01-29 (×4): 3 [IU] via SUBCUTANEOUS

## 2024-01-26 MED ORDER — HEPARIN 6000 UNIT IRRIGATION SOLUTION
Status: DC | PRN
  Administered 2024-01-26: 1

## 2024-01-26 MED ORDER — ROSUVASTATIN CALCIUM 20 MG PO TABS
20.0000 mg | ORAL_TABLET | Freq: Every day | ORAL | Status: DC
  Administered 2024-01-27 – 2024-01-31 (×5): 20 mg via ORAL
  Filled 2024-01-26 (×5): qty 1

## 2024-01-26 MED ORDER — HEPARIN (PORCINE) 25000 UT/250ML-% IV SOLN
500.0000 [IU]/h | INTRAVENOUS | Status: DC
Start: 2024-01-26 — End: 2024-01-28
  Administered 2024-01-26: 2000 [IU]/h via INTRAVENOUS
  Filled 2024-01-26: qty 250

## 2024-01-26 MED ORDER — GUAIFENESIN-DM 100-10 MG/5ML PO SYRP
15.0000 mL | ORAL_SOLUTION | ORAL | Status: DC | PRN
Start: 2024-01-26 — End: 2024-01-31

## 2024-01-26 MED ORDER — DEXTROSE 5 % IV SOLN
INTRAVENOUS | Status: DC | PRN
  Administered 2024-01-26: 3 g via INTRAVENOUS

## 2024-01-26 MED ORDER — ORAL CARE MOUTH RINSE: 15.0000 mL | Freq: Once | OROMUCOSAL | Status: AC

## 2024-01-26 MED ORDER — HEPARIN SODIUM (PORCINE) 1000 UNIT/ML IJ SOLN
INTRAMUSCULAR | Status: DC | PRN
  Administered 2024-01-26: 8000 [IU] via INTRAVENOUS
  Administered 2024-01-26: 5000 [IU] via INTRAVENOUS
  Administered 2024-01-26: 6000 [IU] via INTRAVENOUS
  Administered 2024-01-26: 2000 [IU] via INTRAVENOUS

## 2024-01-26 MED ORDER — PHENYLEPHRINE HCL-NACL 20-0.9 MG/250ML-% IV SOLN
INTRAVENOUS | Status: DC | PRN
  Administered 2024-01-26: 15 ug/min via INTRAVENOUS
  Administered 2024-01-26: 30 ug/min via INTRAVENOUS

## 2024-01-26 MED ORDER — BISACODYL 5 MG PO TBEC: 5.0000 mg | DELAYED_RELEASE_TABLET | Freq: Every day | ORAL | Status: DC | PRN

## 2024-01-26 MED ORDER — ROCURONIUM BROMIDE 10 MG/ML (PF) SYRINGE
PREFILLED_SYRINGE | INTRAVENOUS | Status: DC | PRN
  Administered 2024-01-26: 50 mg via INTRAVENOUS
  Administered 2024-01-26: 20 mg via INTRAVENOUS
  Administered 2024-01-26: 30 mg via INTRAVENOUS

## 2024-01-26 MED ORDER — IODIXANOL 320 MG/ML IV SOLN
INTRAVENOUS | Status: DC | PRN
  Administered 2024-01-26: 90 mL

## 2024-01-26 MED ORDER — ONDANSETRON HCL 4 MG/2ML IJ SOLN
INTRAMUSCULAR | Status: DC | PRN
  Administered 2024-01-26: 4 mg via INTRAVENOUS

## 2024-01-26 MED ORDER — MIDAZOLAM HCL (PF) 10 MG/2ML IJ SOLN
INTRAMUSCULAR | Status: AC
  Filled 2024-01-26: qty 2

## 2024-01-26 MED ORDER — 0.9 % SODIUM CHLORIDE (POUR BTL) OPTIME
TOPICAL | Status: DC | PRN
  Administered 2024-01-26: 2000 mL

## 2024-01-26 MED ORDER — FENTANYL CITRATE (PF) 100 MCG/2ML IJ SOLN
25.0000 ug | INTRAMUSCULAR | Status: DC | PRN
  Administered 2024-01-26 (×4): 50 ug via INTRAVENOUS

## 2024-01-26 MED ORDER — CEFAZOLIN SODIUM-DEXTROSE 2-4 GM/100ML-% IV SOLN
2.0000 g | Freq: Three times a day (TID) | INTRAVENOUS | Status: AC
  Administered 2024-01-26 – 2024-01-27 (×2): 2 g via INTRAVENOUS
  Filled 2024-01-26 (×2): qty 100

## 2024-01-26 MED ORDER — DOCUSATE SODIUM 100 MG PO CAPS
100.0000 mg | ORAL_CAPSULE | Freq: Every day | ORAL | Status: DC
  Administered 2024-01-27 – 2024-01-31 (×4): 100 mg via ORAL
  Filled 2024-01-26 (×5): qty 1

## 2024-01-26 MED ORDER — ACETAMINOPHEN 160 MG/5ML PO SOLN: 1000.0000 mg | Freq: Once | ORAL | Status: DC | PRN

## 2024-01-26 MED ORDER — MIDAZOLAM HCL 2 MG/2ML IJ SOLN
INTRAMUSCULAR | Status: AC
  Filled 2024-01-26: qty 2

## 2024-01-26 MED ORDER — POTASSIUM CHLORIDE CRYS ER 20 MEQ PO TBCR: 20.0000 meq | EXTENDED_RELEASE_TABLET | Freq: Every day | ORAL | Status: DC | PRN

## 2024-01-26 SURGICAL SUPPLY — 89 items
BAG COUNTER SPONGE SURGICOUNT (BAG) ×2 IMPLANT
BAG SNAP BAND KOVER 36X36 (MISCELLANEOUS) ×2 IMPLANT
BALLN MUSTANG 5X150X135 (BALLOONS) ×2 IMPLANT
BALLOON MUSTANG 5X150X135 (BALLOONS) IMPLANT
BANDAGE ESMARK 6X9 LF (GAUZE/BANDAGES/DRESSINGS) IMPLANT
BLADE SURG 11 STRL SS (BLADE) ×2 IMPLANT
BNDG ESMARK 6X9 LF (GAUZE/BANDAGES/DRESSINGS) IMPLANT
CANISTER SUCT 3000ML PPV (MISCELLANEOUS) ×2 IMPLANT
CANISTER WOUND CARE 500ML ATS (WOUND CARE) IMPLANT
CANNULA VESSEL 3MM 2 BLNT TIP (CANNULA) IMPLANT
CATH ANGIO 5F BER2 65CM (CATHETERS) IMPLANT
CATH EMB 3FR 40 (CATHETERS) IMPLANT
CATH EMB 4FR 80 (CATHETERS) IMPLANT
CATH OMNI FLUSH .035X70CM (CATHETERS) IMPLANT
CATH OMNI FLUSH 5F 65CM (CATHETERS) IMPLANT
CATH QUICKCROSS SUPP .035X90CM (MICROCATHETER) IMPLANT
CHLORAPREP W/TINT 26 (MISCELLANEOUS) ×2 IMPLANT
CLIP LIGATING EXTRA MED SLVR (CLIP) ×2 IMPLANT
CLIP LIGATING EXTRA SM BLUE (MISCELLANEOUS) ×2 IMPLANT
CLOSURE MYNX CONTROL 6F/7F (Vascular Products) IMPLANT
COVER DOME SNAP 22 D (MISCELLANEOUS) ×2 IMPLANT
COVER PROBE W GEL 5X96 (DRAPES) ×2 IMPLANT
COVER SURGICAL LIGHT HANDLE (MISCELLANEOUS) ×2 IMPLANT
DERMABOND ADVANCED .7 DNX12 (GAUZE/BANDAGES/DRESSINGS) ×4 IMPLANT
DEVICE CLOSURE MYNXGRIP 6/7F (Vascular Products) IMPLANT
DEVICE TORQUE KENDALL .025-038 (MISCELLANEOUS) IMPLANT
DRAPE C-ARM 42X72 X-RAY (DRAPES) IMPLANT
DRAPE DERMATAC (DRAPES) IMPLANT
DRAPE FEMORAL ANGIO 80X135IN (DRAPES) ×2 IMPLANT
DRAPE HALF SHEET 40X57 (DRAPES) IMPLANT
DRAPE X-RAY CASS 24X20 (DRAPES) IMPLANT
DRSG COVADERM 4X8 (GAUZE/BANDAGES/DRESSINGS) IMPLANT
ELECT REM PT RETURN 9FT ADLT (ELECTROSURGICAL) ×2 IMPLANT
ELECTRODE REM PT RTRN 9FT ADLT (ELECTROSURGICAL) ×2 IMPLANT
GAUZE 4X4 16PLY ~~LOC~~+RFID DBL (SPONGE) ×2 IMPLANT
GLIDEWIRE ADV .035X260CM (WIRE) IMPLANT
GLOVE BIO SURGEON STRL SZ7.5 (GLOVE) ×2 IMPLANT
GOWN STRL REUS W/ TWL LRG LVL3 (GOWN DISPOSABLE) ×4 IMPLANT
GOWN STRL REUS W/ TWL XL LVL3 (GOWN DISPOSABLE) ×2 IMPLANT
GUIDEWIRE ANGLED .035X150CM (WIRE) IMPLANT
HEMOSTAT SNOW SURGICEL 2X4 (HEMOSTASIS) IMPLANT
INSERT FOGARTY SM (MISCELLANEOUS) IMPLANT
KIT BASIN OR (CUSTOM PROCEDURE TRAY) ×2 IMPLANT
KIT ENCORE 26 ADVANTAGE (KITS) IMPLANT
KIT TURNOVER KIT B (KITS) ×2 IMPLANT
NDL PERC 18GX7CM (NEEDLE) ×2 IMPLANT
NEEDLE PERC 18GX7CM (NEEDLE) ×2 IMPLANT
NS IRRIG 1000ML POUR BTL (IV SOLUTION) ×4 IMPLANT
PACK PERIPHERAL VASCULAR (CUSTOM PROCEDURE TRAY) ×2 IMPLANT
PACK SRG BSC III STRL LF ECLPS (CUSTOM PROCEDURE TRAY) ×2 IMPLANT
PAD ARMBOARD 7.5X6 YLW CONV (MISCELLANEOUS) ×4 IMPLANT
PATCH VASC XENOSURE 1X6 (Vascular Products) IMPLANT
POWDER SURGICEL 3.0 GRAM (HEMOSTASIS) IMPLANT
PROTECTION STATION PRESSURIZED (MISCELLANEOUS) ×2 IMPLANT
SET MICROPUNCTURE 5F STIFF (MISCELLANEOUS) ×2 IMPLANT
SHEATH AVANTI 11CM 5FR (SHEATH) IMPLANT
SHEATH HIGHFLEX ANSEL 6FRX55 (SHEATH) IMPLANT
SHEATH PINNACLE 5F 10CM (SHEATH) IMPLANT
SHEATH PINNACLE 6F 10CM (SHEATH) IMPLANT
SHEATH PINNACLE R/O II 6F 4CM (SHEATH) IMPLANT
SHEATH PROBE COVER 6X72 (BAG) IMPLANT
STAPLER SKIN PROX WIDE 3.9 (STAPLE) IMPLANT
STATION PROTECTION PRESSURIZED (MISCELLANEOUS) ×2 IMPLANT
STENT ELUVIA 6X150X130 (Permanent Stent) IMPLANT
STOPCOCK 4 WAY LG BORE MALE ST (IV SETS) IMPLANT
STOPCOCK MORSE 400PSI 3WAY (MISCELLANEOUS) ×2 IMPLANT
SUT ETHILON 3 0 PS 1 (SUTURE) IMPLANT
SUT MNCRL AB 4-0 PS2 18 (SUTURE) ×4 IMPLANT
SUT PROLENE 5 0 C 1 24 (SUTURE) ×2 IMPLANT
SUT PROLENE 6 0 BV (SUTURE) ×2 IMPLANT
SUT SILK 2 0 SH (SUTURE) ×2 IMPLANT
SUT SILK 3-0 18XBRD TIE 12 (SUTURE) IMPLANT
SUT VIC AB 2-0 CT1 TAPERPNT 27 (SUTURE) ×4 IMPLANT
SUT VIC AB 3-0 SH 27X BRD (SUTURE) ×4 IMPLANT
SYR 10ML LL (SYRINGE) ×6 IMPLANT
SYR 20ML LL LF (SYRINGE) ×2 IMPLANT
SYR 30ML LL (SYRINGE) ×2 IMPLANT
SYR 3ML LL SCALE MARK (SYRINGE) IMPLANT
SYR 5ML LL (SYRINGE) IMPLANT
SYR MEDRAD MARK V 150ML (SYRINGE) IMPLANT
TOWEL GREEN STERILE (TOWEL DISPOSABLE) ×4 IMPLANT
TRAY FOLEY MTR SLVR 16FR STAT (SET/KITS/TRAYS/PACK) ×2 IMPLANT
TUBING CIL FLEX 10 FLL-RA (TUBING) IMPLANT
TUBING HIGH PRESSURE 120CM (CONNECTOR) IMPLANT
UNDERPAD 30X36 HEAVY ABSORB (UNDERPADS AND DIAPERS) ×2 IMPLANT
WATER STERILE IRR 1000ML POUR (IV SOLUTION) ×2 IMPLANT
WIRE BENTSON .035X145CM (WIRE) ×2 IMPLANT
WIRE COONS/BENSON .038X145CM (WIRE) IMPLANT
WIRE ROSEN-J .035X260CM (WIRE) IMPLANT

## 2024-01-26 NOTE — Transfer of Care (Signed)
Immediate Anesthesia Transfer of Care Note  Patient: Fred Green  Procedure(s) Performed: RIGHT LOWER EXTREMITY THROMBECTOMY (Right: Leg Lower) RIGHT LOWER EXTREMITY ANGIOGRAM (Right) PATCH ANGIOPLASTY USING XENOSURE PATCH (Right: Leg Lower) INSERTION OF RIGHT POPITEAL ARTERY STENT (Right) RIGHT LOWER LEG FOUR COMPARTMENT FASCIOTOMY (Right: Leg Lower)  Patient Location: PACU  Anesthesia Type:General  Level of Consciousness: awake, alert , and oriented  Airway & Oxygen Therapy: Patient Spontanous Breathing  Post-op Assessment: Report given to RN and Post -op Vital signs reviewed and stable  Post vital signs: Reviewed and stable  Last Vitals:  Vitals Value Taken Time  BP 155/90 01/26/24 1945  Temp    Pulse 96 01/26/24 1949  Resp 15 01/26/24 1949  SpO2 90 % 01/26/24 1949  Vitals shown include unfiled device data.  Last Pain:  Vitals:   01/26/24 1520  TempSrc: Oral  PainSc:          Complications: No notable events documented.

## 2024-01-26 NOTE — Progress Notes (Signed)
Call placed to Dr. Corky Sox to inform of patient's blood pressure. Currently 169/92 and he has remained elevated since arriving to PACU. Patient is currently sleeping with pain controlled. He does have floor orders for blood pressure medications. Dr. Maple Hudson stated that he was "fine with where he's at right now with his blood pressure." No new orders given.  Dr. Maple Hudson is agreeable to patient transferring to [facility] at any time. Report called to unit RN. Patient transported to [facility] rm 2 via hospital bed. He tolerated transport well. No complications or distress noted. Bedside visual assessment and handoff completed with Unit Charge RN, Tim. Patient in no distress.

## 2024-01-26 NOTE — ED Provider Notes (Signed)
[facility]    CSN: [id number] Arrival date & time: 01/26/24  1011      History   Chief Complaint Chief Complaint  Patient presents with   Leg Pain    HPI Fred Green is a 48 y.o. male.   48 year old male who presents to urgent care with complaints of right foot pain, numbness and coldness.  He reports that he went to the foot doctor earlier in the week for gout and had multiple injections done into the foot.  He was also given steroids to take.  He reports that 3 days ago the foot became painful with numbness and tingling and became very cold to the touch.  He is also noticed a significant color change to his foot especially along the toe and the bottom of the foot.  He denies any history of vascular disease or having any ultrasounds to check for vascular disease.  The symptoms are getting worse as he is now having coldness to the touch about halfway up his calf and he has noticed areas on his calf that are no longer healing well.   Leg Pain Associated symptoms: no back pain and no fever     Past Medical History:  Diagnosis Date   GERD (gastroesophageal reflux disease)    Gout    Tobacco abuse     Patient Active Problem List   Diagnosis Date Noted   Back pain 01/11/2016   Tobacco abuse 01/11/2016   Abnormal EKG 01/11/2016   GERD (gastroesophageal reflux disease)    Gastroesophageal reflux disease without esophagitis     History reviewed. No pertinent surgical history.     Home Medications    Prior to Admission medications   Medication Sig Start Date End Date Taking? Authorizing Provider  metFORMIN (GLUCOPHAGE) 500 MG tablet Take 500 mg by mouth 2 (two) times daily with a meal.   Yes [provider]  predniSONE (DELTASONE) 5 MG tablet Take by mouth. 01/20/24  Yes [provider]  ibuprofen (ADVIL,MOTRIN) 200 MG tablet Take 400 mg by mouth every 6 (six) hours as needed (For tooth pain.).   Yes [provider]    Family  History Family History  Problem Relation Age of Onset   Diabetes Mother    Stroke Mother    Cancer Mother    Heart failure Father    Heart attack Father     Social History Social History   Tobacco Use   Smoking status: Every Day    Types: Cigarettes  Vaping Use   Vaping status: Never Used  Substance Use Topics   Alcohol use: No   Drug use: Never     Allergies   Patient has no known allergies.   Review of Systems Review of Systems  Constitutional:  Negative for chills and fever.  HENT:  Negative for ear pain and sore throat.   Eyes:  Negative for pain and visual disturbance.  Respiratory:  Negative for cough and shortness of breath.   Cardiovascular:  Negative for chest pain and palpitations.  Gastrointestinal:  Negative for abdominal pain and vomiting.  Genitourinary:  Negative for dysuria and hematuria.  Musculoskeletal:  Negative for arthralgias and back pain.       Right foot pain and coldness  Skin:  Positive for color change and wound. Negative for rash.  Neurological:  Negative for seizures and syncope.  All other systems reviewed and are negative.    Physical Exam Triage Vital Signs ED Triage Vitals  Encounter Vitals Group     BP 01/26/24 1116 (!) 152/92     Systolic BP Percentile --      Diastolic BP Percentile --      Pulse Rate 01/26/24 1116 92     Resp 01/26/24 1116 18     Temp 01/26/24 1116 97.6 F (36.4 C)     Temp Source 01/26/24 1116 Oral     SpO2 01/26/24 1116 95 %     Weight 01/26/24 1116 (!) 335 lb (152 kg)     Height 01/26/24 1116 6\' 4"  (1.93 m)     Head Circumference --      Peak Flow --      Pain Score 01/26/24 1114 10     Pain Loc --      Pain Education --      Exclude from Growth Chart --    No data found.  Updated Vital Signs BP (!) 152/92 (BP Location: Left Arm)   Pulse 92   Temp 97.6 F (36.4 C) (Oral)   Resp 18   Ht 6\' 4"  (1.93 m)   Wt (!) 335 lb (152 kg)   SpO2 95%   BMI 40.78 kg/m   Visual Acuity Right Eye  Distance:   Left Eye Distance:   Bilateral Distance:    Right Eye Near:   Left Eye Near:    Bilateral Near:     Physical Exam Vitals and nursing note reviewed.  Constitutional:      General: He is not in acute distress.    Appearance: He is well-developed.  HENT:     Head: Normocephalic and atraumatic.  Eyes:     Conjunctiva/sclera: Conjunctivae normal.  Cardiovascular:     Rate and Rhythm: Normal rate and regular rhythm.     Pulses:          Dorsalis pedis pulses are 0 on the right side.       Posterior tibial pulses are 0 on the right side and 1+ on the left side.     Heart sounds: No murmur heard. Pulmonary:     Effort: Pulmonary effort is normal. No respiratory distress.     Breath sounds: Normal breath sounds.  Abdominal:     Palpations: Abdomen is soft.     Tenderness: There is no abdominal tenderness.  Musculoskeletal:        General: No swelling.     Cervical back: Neck supple.  Feet:     Right foot:     Skin integrity: Callus and dry skin present.     Toenail Condition: Right toenails are abnormally thick and long.     Left foot:     Skin integrity: Callus and dry skin present.     Toenail Condition: Left toenails are abnormally thick and long.     Comments: Mottling present on the plantar aspect of the foot and discoloration along the first metatarsal, the foot and leg are cool to touch when compared to the left Skin:    General: Skin is warm and dry.     Capillary Refill: Capillary refill takes less than 2 seconds.  Neurological:     Mental Status: He is alert.  Psychiatric:        Mood and Affect: Mood normal.      UC Treatments / Results  Labs (all labs ordered are listed, but only abnormal results are displayed) Labs Reviewed - No data to display  EKG   Radiology No results found.  Procedures Procedures (including critical care time)  Medications Ordered in UC Medications - No data to display  Initial Impression / Assessment and Plan /  UC Course  I have reviewed the triage vital signs and the nursing notes.  Pertinent labs & imaging results that were available during my care of the patient were reviewed by me and considered in my medical decision making (see chart for details).     Right foot pain   Concerning presentation on the right foot as there is mottling present and no pulse is palpable when compared to the left where posterior tibial pulse is palpable and the foot and leg are warm.  Given this we recommend urgent evaluation at the emergency room where arterial studies can be performed.  Final Clinical Impressions(s) / UC Diagnoses   Final diagnoses:  Right foot pain     Discharge Instructions      Concerning presentation on the right foot as there is mottling present and no pulse is palpable.  Given this we recommend urgent evaluation at the emergency room where arterial studies can be performed.   ED Prescriptions   None    PDMP not reviewed this encounter.   Landis Martins, [place] 01/26/24 1138

## 2024-01-26 NOTE — H&P (Signed)
History of Present Illness: This is a 48 y.o. male without significant vascular history states that since Wednesday he has had discoloration of the toes on his right foot which also feel ice cold.  He has intermittently been able to move the toes states that he has not been able to do this for the last day and had to sleep in a chair last night due to pain.  On further recall he states on January states he was laid off from his job and began having right foot pain that he attributed to gout.  Last Monday he had an injection for gout which did somewhat improve his pain but 2 days later is when the real symptoms began.  Patient does not take any blood thinners.  He does have diabetes and is a current everyday smoker.       Past Medical History:  Diagnosis Date   GERD (gastroesophageal reflux disease)     Gout     Tobacco abuse            History reviewed. No pertinent surgical history.       Allergies  No Known Allergies            Prior to Admission medications   Medication Sig Start Date End Date Taking? Authorizing Provider  ibuprofen (ADVIL,MOTRIN) 200 MG tablet Take 400 mg by mouth every 6 (six) hours as needed (For tooth pain.).       [provider]  metFORMIN (GLUCOPHAGE) 500 MG tablet Take 500 mg by mouth 2 (two) times daily with a meal.       [provider]  predniSONE (DELTASONE) 5 MG tablet Take by mouth. 01/20/24     [provider]      Social History         Socioeconomic History   Marital status: Significant Other      Spouse name: Not on file   Number of children: Not on file   Years of education: Not on file   Highest education level: Not on file  Occupational History   Not on file  Tobacco Use   Smoking status: Every Day      Types: Cigarettes   Smokeless tobacco: Not on file  Vaping Use   Vaping status: Never Used  Substance and Sexual Activity   Alcohol use: No   Drug use: Never   Sexual activity: Yes  Other Topics  Concern   Not on file  Social History Narrative   Not on file    Social Drivers of Health    Financial Resource Strain: Not on file  Food Insecurity: Not on file  Transportation Needs: Not on file  Physical Activity: Not on file  Stress: Not on file  Social Connections: Not on file  Intimate Partner Violence: Not on file             Family History  Problem Relation Age of Onset   Diabetes Mother     Stroke Mother     Cancer Mother     Heart failure Father     Heart attack Father            Review of Systems  Constitutional: Negative.   HENT: Negative.    Eyes: Negative.   Respiratory: Negative.    Cardiovascular: Negative.   Genitourinary: Negative.   Musculoskeletal: Negative.        Leg pain as above  Skin: Negative.   Neurological:  Positive  for tingling and weakness.  Endo/Heme/Allergies: Negative.   Psychiatric/Behavioral: Negative.            Physical Examination Vitals:   01/26/24 1205 01/26/24 1520  BP: (!) 175/106 (!) 152/90  Pulse: 96 96  Resp: 18 18  Temp: 98.2 F (36.8 C) 97.9 F (36.6 C)  SpO2: 99% 94%    Physical Exam HENT:     Head: Normocephalic.     Mouth/Throat:     Mouth: Mucous membranes are moist.  Cardiovascular:     Rate and Rhythm: Normal rate.     Pulses:          Femoral pulses are 0 on the right side and 2+ on the left side.      Popliteal pulses are 0 on the right side.       Dorsalis pedis pulses are 0 on the right side and 2+ on the left side.       Posterior tibial pulses are 0 on the right side and 2+ on the left side.  Pulmonary:     Effort: Pulmonary effort is normal.  Abdominal:     General: Abdomen is flat.  Musculoskeletal:     Comments: Right lower extremity is very cold from the knee down  Neurological:     Mental Status: He is alert.     Comments: Right foot is numb weaker to dorsiflexion on the left         CBC Labs (Brief)          Component Value Date/Time    WBC 9.6 01/11/2016 1755     RBC 5.08 01/11/2016 1755    HGB 16.3 01/11/2016 1802    HCT 48.0 01/11/2016 1802    PLT 380 01/11/2016 1755    MCV 87.8 01/11/2016 1755    MCH 30.5 01/11/2016 1755    MCHC 34.8 01/11/2016 1755    RDW 13.2 01/11/2016 1755    LYMPHSABS 3.7 01/11/2016 1755    MONOABS 0.5 01/11/2016 1755    EOSABS 0.3 01/11/2016 1755    BASOSABS 0.0 01/11/2016 1755        BMET Labs (Brief)          Component Value Date/Time    NA 140 01/12/2016 0615    K 4.2 01/12/2016 0615    CL 106 01/12/2016 0615    CO2 25 01/12/2016 0615    GLUCOSE 127 (H) 01/12/2016 0615    BUN 12 01/12/2016 0615    CREATININE 0.92 01/12/2016 0615    CALCIUM 9.5 01/12/2016 0615    GFRNONAA >60 01/12/2016 0615    GFRAA >60 01/12/2016 0615        COAGS: Recent Labs       Lab Results  Component Value Date    INR 1.06 01/11/2016          Non-Invasive Vascular Imaging:   CT IMPRESSION: Vascular Impression:   1. Moderate amount of predominantly noncalcified atherosclerotic plaque throughout a normal caliber abdominal aorta, not resulting in a hemodynamically significant stenosis. Aortic Atherosclerosis (ICD10-I70.0).   Vascular Impression of the right lower extremity:   1. Short-segment nonocclusive intimal web involving the right common femoral artery. 2. Abrupt short-segment occlusion of both the right above and below-knee popliteal arteries as detailed above, nonspecific though worrisome for the sequela of distal embolism. 3. Abrupt occlusion of the right anterior tibial artery, again worrisome for the sequela of distal embolism. 4. Predominantly single-vessel runoff to the right lower leg and foot via the  right posterior tibial artery. A patent right-sided dorsalis pedis artery is not identified.   Vascular Impression of the left lower extremity:   1. No evidence of distal embolism affecting the left lower extremity. 2. Approximately 50% luminal narrowing involving the distal aspect of the left  popliteal artery. 3. Short-segment occlusion of the left anterior tibial artery with early reconstitution which goes on to supply a patent left sided dorsalis pedis artery. The left posterior tibial and peroneal arteries both remain patent.   Nonvascular Impression:   1. No evidence of embolism affecting the abdominal organs. 2. Cholelithiasis without evidence of acute cholecystitis. 3. Mild thickening of the urinary bladder wall, potentially accentuated due to underdistention. Correlation with urinalysis is advised to exclude cystitis.     ASSESSMENT/PLAN:  48 y.o. male with ischemic right lower extremity possibly of 1 month duration.  CTA reviewed discussed with patient.  Plan for OR today with right lower extremity thromboembolectomy likely fasciotomies possible bypass possible angiogram with possible intervention.  We again discussed the high risk nature of requiring more proximal amputation in the future he demonstrates good understanding.  All questions were answered and consent was signed.   Marquinn Meschke C. Randie Heinz, MD [facility] Office: [phone] Pager: [phone]

## 2024-01-26 NOTE — Anesthesia Preprocedure Evaluation (Addendum)
Anesthesia Evaluation  Patient identified by MRN, date of birth, ID band Patient awake    Reviewed: Allergy & Precautions, NPO status , Patient's Chart, lab work & pertinent test results  History of Anesthesia Complications Negative for: history of anesthetic complications  Airway Mallampati: I  TM Distance: >3 FB Neck ROM: Full    Dental  (+) Missing, Dental Advisory Given   Pulmonary neg shortness of breath, neg sleep apnea, neg COPD, neg recent URI, Current Smoker and Patient abstained from smoking.   breath sounds clear to auscultation       Cardiovascular (-) angina + Peripheral Vascular Disease  (-) Past MI  Rhythm:Regular     Neuro/Psych negative neurological ROS  negative psych ROS   GI/Hepatic Neg liver ROS,GERD  ,,  Endo/Other  diabetes  Class 3 obesityLab Results      Component                Value               Date                      HGBA1C                   7.4 (H)             01/11/2016             Renal/GU      Musculoskeletal negative musculoskeletal ROS (+)    Abdominal   Peds  Hematology Lab Results      Component                Value               Date                      WBC                      13.5 (H)            01/26/2024                HGB                      17.2 (H)            01/26/2024                HCT                      50.6                01/26/2024                MCV                      87.7                01/26/2024                PLT                      385                 01/26/2024              Anesthesia Other Findings   Reproductive/Obstetrics  Anesthesia Physical Anesthesia Plan  ASA: 3 and emergent  Anesthesia Plan: General   Post-op Pain Management: Ofirmev IV (intra-op)*   Induction: Intravenous  PONV Risk Score and Plan: 2 and Ondansetron and Dexamethasone  Airway Management Planned: Oral  ETT  Additional Equipment: Arterial line  Intra-op Plan:   Post-operative Plan: Extubation in OR  Informed Consent: I have reviewed the patients History and Physical, chart, labs and discussed the procedure including the risks, benefits and alternatives for the proposed anesthesia with the patient or authorized representative who has indicated his/her understanding and acceptance.     Dental advisory given  Plan Discussed with: CRNA  Anesthesia Plan Comments:        Anesthesia Quick Evaluation

## 2024-01-26 NOTE — ED Triage Notes (Signed)
Pain right leg x 3 days. Patient states he had gout and went to see a foot doctor where he received multiple injections in the foot. Was given prednisone pills to take.   After being on this 3 days started having chills and numbness in the right leg and foot only. When laying down having shooting pain in the calf down.

## 2024-01-26 NOTE — Progress Notes (Signed)
ANTICOAGULATION CONSULT NOTE - Initial Consult  Pharmacy Consult for Heparin Indication:  ischemic right lower extremity  No Known Allergies  Patient Measurements: Height: 6\' 4"  (193 cm) Weight: (!) 151.9 kg (334 lb 14.1 oz) IBW/kg (Calculated) : 86.8 Heparin Dosing Weight: 121.5 kg  Vital Signs: Temp: 98.2 F (36.8 C) (02/02 1205) Temp Source: Oral (02/02 1116) BP: 175/106 (02/02 1205) Pulse Rate: 96 (02/02 1205)  Labs: Recent Labs    01/26/24 1225  HGB 17.2*  HCT 50.6  PLT 385  APTT 27  LABPROT 13.0  INR 1.0  CREATININE 1.10    Estimated Creatinine Clearance: 131 mL/min (by C-G formula based on SCr of 1.1 mg/dL).   Medical History: Past Medical History:  Diagnosis Date   GERD (gastroesophageal reflux disease)    Gout    Tobacco abuse     Medications:  (Not in a hospital admission)  Scheduled:  Infusions:  PRN:   Assessment: 47 yom presenting with discoloration of toes on right foot. Heparin per pharmacy consult placed for  ischemic right lower extremity .  Patient is not on anticoagulation prior to arrival.  Hgb 17.2; plt 385 aPTT 27; PT/INR 13/1  Goal of Therapy:  Heparin level 0.3-0.7 units/ml Monitor platelets by anticoagulation protocol: Yes   Plan:  Give IV heparin 6500 units bolus x 1 Start heparin infusion at 2000 units/hr Check anti-Xa level in 6 hours and daily while on heparin Continue to monitor H&H and platelets  Delmar Landau, PharmD, BCPS 01/26/2024 1:29 PM ED Clinical Pharmacist -  [phone]

## 2024-01-26 NOTE — Anesthesia Procedure Notes (Signed)
Procedure Name: Intubation Date/Time: 01/26/2024 4:12 PM  Performed by: Laruth Bouchard., CRNAPre-anesthesia Checklist: Patient identified, Emergency Drugs available, Suction available, Patient being monitored and Timeout performed Patient Re-evaluated:Patient Re-evaluated prior to induction Oxygen Delivery Method: Circle system utilized Preoxygenation: Pre-oxygenation with 100% oxygen Induction Type: IV induction, Rapid sequence and Cricoid Pressure applied Laryngoscope Size: Mac and 4 Grade View: Grade I Tube type: Oral Tube size: 7.5 mm Number of attempts: 1 Airway Equipment and Method: Stylet Placement Confirmation: ETT inserted through vocal cords under direct vision, positive ETCO2 and breath sounds checked- equal and bilateral Secured at: 23 cm Tube secured with: Tape Dental Injury: Teeth and Oropharynx as per pre-operative assessment

## 2024-01-26 NOTE — Consult Note (Signed)
ED Consult    Reason for Consult:  cold right foot Referring Physician:  Dr. Jeraldine Loots MRN #:  [id number]  History of Present Illness: This is a 48 y.o. male without significant vascular history states that since Wednesday he has had discoloration of the toes on his right foot which also feel ice cold.  He has intermittently been able to move the toes states that he has not been able to do this for the last day and had to sleep in a chair last night due to pain.  On further recall he states on January states he was laid off from his job and began having right foot pain that he attributed to gout.  Last Monday he had an injection for gout which did somewhat improve his pain but 2 days later is when the real symptoms began.  Patient does not take any blood thinners.  He does have diabetes and is a current everyday smoker.  Past Medical History:  Diagnosis Date   GERD (gastroesophageal reflux disease)    Gout    Tobacco abuse     History reviewed. No pertinent surgical history.  No Known Allergies  Prior to Admission medications   Medication Sig Start Date End Date Taking? Authorizing Provider  ibuprofen (ADVIL,MOTRIN) 200 MG tablet Take 400 mg by mouth every 6 (six) hours as needed (For tooth pain.).    [provider]  metFORMIN (GLUCOPHAGE) 500 MG tablet Take 500 mg by mouth 2 (two) times daily with a meal.    [provider]  predniSONE (DELTASONE) 5 MG tablet Take by mouth. 01/20/24   [provider]    Social History   Socioeconomic History   Marital status: Significant Other    Spouse name: Not on file   Number of children: Not on file   Years of education: Not on file   Highest education level: Not on file  Occupational History   Not on file  Tobacco Use   Smoking status: Every Day    Types: Cigarettes   Smokeless tobacco: Not on file  Vaping Use   Vaping status: Never Used  Substance and Sexual Activity   Alcohol use: No   Drug use: Never    Sexual activity: Yes  Other Topics Concern   Not on file  Social History Narrative   Not on file   Social Drivers of Health   Financial Resource Strain: Not on file  Food Insecurity: Not on file  Transportation Needs: Not on file  Physical Activity: Not on file  Stress: Not on file  Social Connections: Not on file  Intimate Partner Violence: Not on file     Family History  Problem Relation Age of Onset   Diabetes Mother    Stroke Mother    Cancer Mother    Heart failure Father    Heart attack Father     Review of Systems  Constitutional: Negative.   HENT: Negative.    Eyes: Negative.   Respiratory: Negative.    Cardiovascular: Negative.   Genitourinary: Negative.   Musculoskeletal: Negative.        Leg pain as above  Skin: Negative.   Neurological:  Positive for tingling and weakness.  Endo/Heme/Allergies: Negative.   Psychiatric/Behavioral: Negative.        Physical Examination  Vitals:   01/26/24 1205  BP: (!) 175/106  Pulse: 96  Resp: 18  Temp: 98.2 F (36.8 C)  SpO2: 99%   Body mass index is 40.76 kg/m.  Physical Exam HENT:     Head: Normocephalic.     Mouth/Throat:     Mouth: Mucous membranes are moist.  Cardiovascular:     Rate and Rhythm: Normal rate.     Pulses:          Femoral pulses are 0 on the right side and 2+ on the left side.      Popliteal pulses are 0 on the right side.       Dorsalis pedis pulses are 0 on the right side and 2+ on the left side.       Posterior tibial pulses are 0 on the right side and 2+ on the left side.  Pulmonary:     Effort: Pulmonary effort is normal.  Abdominal:     General: Abdomen is flat.  Musculoskeletal:     Comments: Right lower extremity is very cold from the knee down  Neurological:     Mental Status: He is alert.     Comments: Right foot is numb weaker to dorsiflexion on the left      CBC    Component Value Date/Time   WBC 9.6 01/11/2016 1755   RBC 5.08 01/11/2016 1755   HGB  16.3 01/11/2016 1802   HCT 48.0 01/11/2016 1802   PLT 380 01/11/2016 1755   MCV 87.8 01/11/2016 1755   MCH 30.5 01/11/2016 1755   MCHC 34.8 01/11/2016 1755   RDW 13.2 01/11/2016 1755   LYMPHSABS 3.7 01/11/2016 1755   MONOABS 0.5 01/11/2016 1755   EOSABS 0.3 01/11/2016 1755   BASOSABS 0.0 01/11/2016 1755    BMET    Component Value Date/Time   NA 140 01/12/2016 0615   K 4.2 01/12/2016 0615   CL 106 01/12/2016 0615   CO2 25 01/12/2016 0615   GLUCOSE 127 (H) 01/12/2016 0615   BUN 12 01/12/2016 0615   CREATININE 0.92 01/12/2016 0615   CALCIUM 9.5 01/12/2016 0615   GFRNONAA >60 01/12/2016 0615   GFRAA >60 01/12/2016 0615    COAGS: Lab Results  Component Value Date   INR 1.06 01/11/2016     Non-Invasive Vascular Imaging:   No studies  ASSESSMENT/PLAN:  48 y.o. male with ischemic right lower extremity possibly of 1 month duration.  I cannot reliably palpate a right femoral pulse there is an easily palpable 2+ left common femoral pulse.  Will begin with urgent heparin administration with bolus and CT angio abdomen pelvis to include the right lower extremity.  I discussed with the patient and his significant other at bedside that this is a limb threatening situation with or without revascularization without he will likely require at least endovascular revascularization likely open procedure given time course and likelihood of thrombosis of the entirety of the right lower extremity.  We will know more after CT angio and will discuss proceeding with surgery at that time.  Patient will remain.  Cassady Turano C. Randie Heinz, MD [facility] Office: [phone] Pager: [phone]

## 2024-01-26 NOTE — Op Note (Signed)
Patient name: Fred Green MRN: [id number] DOB: [birth year] Sex: male  01/26/2024 Pre-operative Diagnosis: Acute on chronic right lower extremity limb threatening ischemia Post-operative diagnosis:  Same Surgeon:  Luanna Salk. Randie Heinz, MD Assistant: Nathanial Rancher, PA Procedure Performed: 1.  Right lower extremity thromboembolectomy via right below-knee popliteal artery exposure 2.  Right popliteal and tibioperoneal trunk endarterectomy with bovine pericardial patch angioplasty 3.  Percutaneous access and Mynx device closure left common femoral artery 4.  Right lower extremity angiogram 5.  Stent of right popliteal and superficial femoral arteries with 6 x 150 mm Eluvia x 2 postdilated with 5 mm balloon 6.  Right lower extremity 4 compartment fasciotomies with application of negative pressure dressing medial fasciotomy site and closure of lateral fasciotomy site   Indications: 48 year old male presented with acute on chronic right lower extremity limb threatening ischemia with CT evidence of small webbing in the right common femoral artery but 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  He was indicated for right lower extremity thromboembolectomy with possible bypass, possible right lower extremity angiogram and possible fasciotomies.  Experience assistant was necessary to facilitate exposure of the below-knee popliteal artery as well as perform extensive endarterectomy and patch angioplasty and thrombectomy of the right lower extremity including the posterior tibial artery, anterior tibial artery, and popliteal and superficial femoral arteries as well as perform right lower extremity angiography with stenting including passage of catheters, wires and sheaths.  Findings: Below the knee the right popliteal artery was occluded with acute thrombus but also had significant calcification.  An extensive endarterectomy was performed including the popliteal  artery down to the tibioperoneal trunk bifurcation and a patch angioplasty was performed.  Prior to this embolectomy was performed with 3 Fogarty distally which was able to pass only 10 cm down the anterior tibial artery I could not really engage the peroneal artery more than the tip of the Fogarty but the posterior tibial artery the Fogarty was passed all the way to the hub and did return acute thrombus and there was strong backbleeding.  Proximally we passed 3 and 4 Fogarty's all the way past the common femoral artery and were able to return some antegrade bleeding but this was not strongly pulsatile.  After completing the patch angioplasty we cannulated the left common femoral artery perform right lower extremity angiography which demonstrated cutoff of the popliteal artery above the knee with acute thrombosis and there was also dissection in the superficial femoral artery.  We then stented from the below-knee popliteal artery all the way to the mid SFA with drug-eluting stents and a completion there was brisk runoff to the foot with the posterior tibial artery having a strong signal at the ankle and was palpable within the fasciotomy sites.  The lateral fasciotomy site was closed in the medial fasciotomy site was temporarily closed with wound VAC.   Procedure:  The patient was identified in the holding area and taken to the operating room where he was placed supine operative table and general anesthesia was induced.  He was sterilely prepped and draped in the left groin and right lower extremity in usual fashion, antibiotics were administered and a timeout was called.  We began with below-knee popliteal artery standard exposure incision dissected down through the skin and subcutaneous tissue and opened the fascia and dissected down to remove the soleus bridge proximally identified the popliteal vein.  The medial popliteal vein was ultimately divided for better exposure and the  popliteal artery was identified  and additional heparin was administered.  We encircled the popliteal artery proximally with vessel loop and dissected down to the anterior tibial artery which was densely scarred and was very difficult to evaluate.  Tibioperoneal trunk vein was divided ultimately the popliteal vein as above was divided.  We then identified the tibioperoneal trunk and dissected out of the bifurcation encircled these with Vesseloops isolating both the posterior tibial and peroneal arteries.  We then opened the vessel longitudinally with plan for endarterectomy.  We passed 3 and 4 Fogarty's proximally multiple times did return clot and intima were unable to establish very strong inflow.  Distally I could get a 3 Fogarty 10 cm down the anterior tibial artery and really could not engage the peroneal artery but I was able to get a 3 Fogarty all the way down to the foot and return some clot from the posterior tibial artery and there was very strong backbleeding this was flushed with heparinized saline and clamped.  A bovine pericardial patch standard size was then divided into and sewn together and sewn in place as a very long patch angioplasty of the popliteal and tibioperoneal trunk arteries.  Prior to completion we flushed with heparinized saline released the clamps the patient did not have any hemodynamic change and there was minimal pulsatility and signal within the right below-knee popliteal artery.  With this ultrasound was used to identify the left common femoral artery which was cannulated with a micropuncture needle followed by wire and a sheath and a Bentson wire was placed followed by 5 [place] sheath.  An Omni catheter was used to cross the bifurcation with Bentson wire we performed right lower extremity angiography.  With the above findings we placed the Glidewire advantage into the right SFA placed a long 6 French sheath.  Patient's heparinization was maintained throughout the case.  We then used quick cross catheter and  Glidewire advantage and cross into the below-knee popliteal artery where we identified a very large posterior tibial artery is the runoff to the foot.  We then began with stenting from the popliteal artery all the way back to the SFA with 6 x 150 mm Eluvia's x 2 postdilated with 5 mm balloon.  Completion there was no residual dissection no stenosis in the SFA or popliteal artery with brisk runoff via the posterior tibial artery which was confirmed with Doppler.  We then extended our medial incision and performed and a medial fasciotomy was performed.  Concomitantly we exchanged the left common femoral sheath for a short 6 French sheath and placed a minx device which deployed well and at completion there was a posterior tibial pulse on the left.  We also performed incision on the right lateral leg and performed lateral and anterior fasciotomies.  These did not bulge much and were able to be closed.  Medially we thoroughly irrigated the wounds there was a palpable posterior tibial pulse after opening the deep fascia.  We were able to close some tissue over the endarterectomy and patch angioplasty site with interrupted 2-0 Vicryl and then fashioned a wound VAC and this was placed on the medial fasciotomy site.  The patient was then awakened from anesthesia having tolerated the procedure without immediate complication.  All counts were correct at completion.  EBL: 300 cc  Contrast: 90 cc    Nicoya Friel C. Randie Heinz, MD [facility] Office: [phone] Pager: [phone]

## 2024-01-26 NOTE — ED Notes (Signed)
Patient is being discharged from the Urgent Care and sent to the Emergency Department via personal opperated vehicle . Per Doristine Mango PA, patient is in need of higher level of care due to possible blood clot in the left leg. Patient is aware and verbalizes understanding of plan of care.  Vitals:   01/26/24 1116  BP: (!) 152/92  Pulse: 92  Resp: 18  Temp: 97.6 F (36.4 C)  SpO2: 95%

## 2024-01-26 NOTE — ED Provider Notes (Signed)
[place] [facility] AT [facility] Provider Note   CSN: [id number] Arrival date & time: 01/26/24  1149     History  Chief Complaint  Patient presents with   Circulatory Problem    Fred Green is a 48 y.o. male.  HPI With history of gout, purportedly, presents with right ankle and foot pain.  Patient is a smoker, states that he is otherwise well aside from these problems.  For the past 4 days, began 3 days after his injection, he is developed pain throughout the right lower extremity from the mid calf inferiorly.  Pain is somewhat improved with upright positioning.  There is associated darkening of the skin, distally.  No systemic complaints.  Patient went urgent care and was sent here from there for evaluation.    Home Medications Prior to Admission medications   Medication Sig Start Date End Date Taking? Authorizing Provider  metFORMIN (GLUCOPHAGE) 500 MG tablet Take 500 mg by mouth 2 (two) times daily with a meal.   Yes [provider]  predniSONE (DELTASONE) 5 MG tablet Take by mouth. 01/20/24  Yes [provider]  ibuprofen (ADVIL,MOTRIN) 200 MG tablet Take 400 mg by mouth every 6 (six) hours as needed (For tooth pain.).    [provider]      Allergies    Patient has no known allergies.    Review of Systems   Review of Systems  Physical Exam Updated Vital Signs BP (!) 152/90   Pulse 96   Temp 97.9 F (36.6 C) (Oral)   Resp 18   Ht 6' 3.98" (1.93 m)   Wt (!) 151.9 kg   SpO2 94%   BMI 40.78 kg/m  Physical Exam Vitals and nursing note reviewed.  Constitutional:      General: He is not in acute distress.    Appearance: He is well-developed.  HENT:     Head: Normocephalic and atraumatic.  Eyes:     Conjunctiva/sclera: Conjunctivae normal.  Cardiovascular:     Rate and Rhythm: Normal rate and regular rhythm.     Comments: No appreciable pulses palpated in the right lower extremity, dorsalis pedis, posterior  tibial, manually or with Doppler Pulmonary:     Effort: Pulmonary effort is normal. No respiratory distress.     Breath sounds: No stridor.  Abdominal:     General: There is no distension.  Musculoskeletal:     Comments: Right foot with darkening of the first second third digits, erythema of the dorsum of the foot.  Skin:    General: Skin is warm and dry.  Neurological:     Mental Status: He is alert and oriented to person, place, and time.     ED Results / Procedures / Treatments   Labs (all labs ordered are listed, but only abnormal results are displayed) Labs Reviewed  CBC WITH DIFFERENTIAL/PLATELET - Abnormal; Notable for the following components:      Result Value   WBC 13.5 (*)    Hemoglobin 17.2 (*)    Neutro Abs 9.8 (*)    All other components within normal limits  COMPREHENSIVE METABOLIC PANEL - Abnormal; Notable for the following components:   Sodium 132 (*)    Potassium 5.4 (*)    Chloride 95 (*)    Glucose, Bld 326 (*)    BUN 22 (*)    Total Bilirubin 1.4 (*)    All other components within normal limits  GLUCOSE, CAPILLARY - Abnormal; Notable for the  following components:   Glucose-Capillary 255 (*)    All other components within normal limits  POCT I-STAT, CHEM 8 - Abnormal; Notable for the following components:   Sodium 128 (*)    Potassium 7.7 (*)    BUN 30 (*)    Glucose, Bld 275 (*)    Calcium, Ion 0.75 (*)    Hemoglobin 18.0 (*)    HCT 53.0 (*)    All other components within normal limits  SURGICAL PCR SCREEN  PROTIME-INR  APTT  HEPARIN LEVEL (UNFRACTIONATED)  HEPARIN LEVEL (UNFRACTIONATED)  TYPE AND SCREEN  ABO/RH    EKG None  Radiology HYBRID OR IMAGING ([facility] ONLY) Result Date: 01/26/2024 There is no interpretation for this exam.  This order is for images obtained during a surgical procedure.  Please See "Surgeries" Tab for more information regarding the procedure.   CT Angio Aortobifemoral W and/or Wo Contrast Result Date:  01/26/2024 CLINICAL DATA:  Right lower extremity claudication symptoms. EXAM: CT ANGIOGRAPHY OF ABDOMINAL AORTA WITH ILIOFEMORAL RUNOFF TECHNIQUE: Multidetector CT imaging of the abdomen, pelvis and lower extremities was performed using the standard protocol during bolus administration of intravenous contrast. Multiplanar CT image reconstructions and MIPs were obtained to evaluate the vascular anatomy. RADIATION DOSE REDUCTION: This exam was performed according to the departmental dose-optimization program which includes automated exposure control, adjustment of the mA and/or kV according to patient size and/or use of iterative reconstruction technique. CONTRAST:  OMNIPAQUE IOHEXOL 350 MG/ML SOLN COMPARISON:  Chest CTA-01/11/2016 FINDINGS: VASCULAR Aorta: There is a moderate amount of predominantly noncalcified slightly irregular atherosclerotic plaque throughout a normal caliber abdominal aorta, not resulting in hemodynamically significant stenosis no evidence of abdominal aortic dissection or perivascular stranding. Celiac: Widely patent without a hemodynamically significant narrowing. SMA: Widely patent without a hemodynamically significant narrowing. The distal tributaries the SMA appear widely patent without discrete lumen filling defect to suggest distal embolism. Renals: Duplicated bilaterally; there are accessory bilateral renal arteries which supply the inferior poles of the kidneys. Both accessory renal arteries arise inferior to the takeoff of the IMA. The dominant bilateral renal arteries are widely patent without hemodynamically significant narrowing. No vessel irregularity to suggest FMD. IMA: Widely patent without hemodynamically significant narrowing. _________________________________________________________ RIGHT Lower Extremity Inflow: There is a minimal amount of predominantly noncalcified atherosclerotic plaque involving the right common and external iliac arteries, not resulting in  hemodynamically significant stenosis. The right internal iliac artery is diseased though patent and of normal caliber. Outflow: There is a nonocclusive intimal web within the right common femoral artery (image 33, series 7). The right deep femoral artery is of normal caliber and widely patent without hemodynamically significant narrowing. The right superficial femoral artery is of normal caliber and widely patent without hemodynamically significant narrowing. There is short-segment occlusion of 4.5 cm of the right above knee popliteal artery (axial image 421, series 7; coronal image 67, series 11). The distal aspect of the right above knee popliteal artery is reconstituted via supra geniculate and deep thigh arterial collaterals. While the intra-articular segment of the popliteal artery remains patent, there is occlusion of the approximately 3.5 cm of the right below-knee popliteal artery (representative image 483, series 7; coronal image 59, series 11). The distal most aspect of the right below-knee popliteal artery is reconstituted via infrageniculate arterial collaterals. Runoff: There is abrupt occlusion of the proximal aspect of the right anterior tibial artery (image 523, series 7). There is a single-vessel atretic runoff to the right lower leg via the posterior  tibial artery as the peroneal artery tapers at the level of the mid/distal tibia. A patent right-sided dorsalis pedis artery is not identified. _________________________________________________________ LEFT Lower Extremity Inflow: There is a minimal to moderate amount of predominantly noncalcified atherosclerotic plaque involving the left common and external iliac arteries, not resulting in a hemodynamically significant narrowing. The left internal iliac artery is diseased though patent and of normal caliber. Outflow: There is a minimal amount of noncalcified atherosclerotic plaque involving the left common femoral artery, not resulting in  hemodynamically significant narrowing. The left deep and superficial femoral arteries are of normal caliber and without hemodynamically significant narrowing. Eccentric noncalcified atherosclerotic plaque involving the distal aspect of the left above knee popliteal artery approaches 50% luminal narrowing (image 463, series 7). The left below-knee popliteal artery is of normal caliber and widely patent without a hemodynamically significant narrowing. Runoff: There is short-segment occlusion of the proximal aspect of the left anterior tibial artery (image 515, series 7) which is reconstituted via deep calf collaterals and goes on to supply the left-sided dorsalis pedis artery which is patent to the level of the forefoot. Both the left posterior tibial and peroneal arteries remain patent without hemodynamically significant narrowings. Veins: The IVC and pelvic venous systems appear patent on this arterial phase examination. Review of the MIP images confirms the above findings. _________________________________________________________ _________________________________________________________ NON-VASCULAR Evaluation of the abdominal organs is limited to the arterial phase of enhancement. Lower chest: Limited visualization of the lower thorax is negative for focal airspace opacity or pleural effusion. Normal heart size. No pericardial effusion. Hepatobiliary: Normal hepatic contour. No discrete hyperenhancing hepatic lesions. Layering gallstones are seen within otherwise normal-appearing gallbladder. There is an apparent stone ossicle located within the gallbladder fundus versus the adjacent hepatic parenchyma, suboptimally evaluated due to patient motion. No ascites. Pancreas: Normal appearance of the pancreas. Spleen: Normal appearance of the spleen. Adrenals/Urinary Tract: There is symmetric enhancement of the bilateral kidneys. No evidence of nephrolithiasis on this postcontrast examination. No discrete renal lesions.  No urinary obstruction or perinephric stranding Normal appearance of the bilateral adrenal glands. There is mild thickening the urinary bladder wall, potentially accentuated due to underdistention. Stomach/Bowel: Large colonic stool burden without evidence of enteric obstruction. Normal appearance of the terminal ileum and the retrocecal appendix. No significant hiatal hernia. No pneumoperitoneum, pneumatosis or portal venous gas. Lymphatic: No bulky retroperitoneal, mesenteric, pelvic or inguinal lymph adenopathy. Reproductive: Normal appearance of the prostate gland. No free fluid in the pelvic cul-de-sac. Other: Minimal amount of subcutaneous edema about the midline of the low back. Musculoskeletal: No acute or aggressive osseous abnormalities. Mild-to-moderate multilevel lumbar spine DDD, worse at L4-L5 with disc space height loss, endplate irregularity and small posteriorly directed disc osteophyte complex at this location. Stigmata of dish within the lower thoracic spine. IMPRESSION: Vascular Impression: 1. Moderate amount of predominantly noncalcified atherosclerotic plaque throughout a normal caliber abdominal aorta, not resulting in a hemodynamically significant stenosis. Aortic Atherosclerosis (ICD10-I70.0). Vascular Impression of the right lower extremity: 1. Short-segment nonocclusive intimal web involving the right common femoral artery. 2. Abrupt short-segment occlusion of both the right above and below-knee popliteal arteries as detailed above, nonspecific though worrisome for the sequela of distal embolism. 3. Abrupt occlusion of the right anterior tibial artery, again worrisome for the sequela of distal embolism. 4. Predominantly single-vessel runoff to the right lower leg and foot via the right posterior tibial artery. A patent right-sided dorsalis pedis artery is not identified. Vascular Impression of the left lower extremity: 1. No  evidence of distal embolism affecting the left lower extremity.  2. Approximately 50% luminal narrowing involving the distal aspect of the left popliteal artery. 3. Short-segment occlusion of the left anterior tibial artery with early reconstitution which goes on to supply a patent left sided dorsalis pedis artery. The left posterior tibial and peroneal arteries both remain patent. Nonvascular Impression: 1. No evidence of embolism affecting the abdominal organs. 2. Cholelithiasis without evidence of acute cholecystitis. 3. Mild thickening of the urinary bladder wall, potentially accentuated due to underdistention. Correlation with urinalysis is advised to exclude cystitis. Critical Value/emergent results were called by telephone at the time of interpretation on 01/26/2024 at 2:40 pm to provider Gerhard Munch , who verbally acknowledged these results. Electronically Signed   By: Simonne Come M.D.   On: 01/26/2024 14:42    Procedures Procedures    Medications Ordered in ED Medications  heparin ADULT infusion 100 units/mL (25000 units/256mL) (0 Units/hr Intravenous Stopped 01/26/24 1644)  lactated ringers infusion ( Intravenous Continued from Pre-op 01/26/24 1603)  0.9 % irrigation (POUR BTL) (2,000 mLs Irrigation Given 01/26/24 1649)  heparin 6000 units / NS 500 mL irrigation (1 Application Irrigation Given 01/26/24 1649)  hemostatic agents (no charge) Optime (1 Application Topical Given 01/26/24 1809)  iodixanol (VISIPAQUE) 320 MG/ML injection (90 mLs Other Given 01/26/24 1852)  heparin bolus via infusion 6,500 Units (6,500 Units Intravenous Bolus from Bag 01/26/24 1408)  iohexol (OMNIPAQUE) 350 MG/ML injection 100 mL (100 mLs Intravenous Contrast Given 01/26/24 1359)  chlorhexidine (PERIDEX) 0.12 % solution 15 mL (15 mLs Mouth/Throat Given 01/26/24 1537)    Or  Oral care mouth rinse ( Mouth Rinse See Alternative 01/26/24 1537)    ED Course/ Medical Decision Making/ A&P                                 Medical Decision Making Adult male presents with signs and symptoms  concerning for peripheral vascular disease with acute changes over the past few days.  Absence of pulses is concerning, case discussed with vascular surgery, by physician assistant soon after the patient's arrival.  Labs sent.   Amount and/or Complexity of Data Reviewed External Data Reviewed: notes.    Details: Urgent care note from today reviewed Labs: ordered. Decision-making details documented in ED Course. Radiology: ordered.  Risk Prescription drug management. Decision regarding hospitalization. Diagnosis or treatment significantly limited by social determinants of health.   Update: After the patient's expedited eval in the triage room Case discussed with vascular surgery by the physician assistant.  Patient proceeded to have CT angiography of the lower extremities.  I subsequently discussed these findings with the radiologist, notable for substantial loss of signal of arterial flow in the popliteal and inferior area on the right.  Given these findings, our vascular surgeon, prepared for the patient to go to the operating room for bypass.  Subsequently, the patient was joined by his wife.  Together the 3 of [place] discussed his findings, history, hyperglycemia, need for smoking cessation and most notably today's findings, with substantial signal loss of arterial flow concerning for ischemia of his right lower extremity.  Given these findings, patient was taken to the operating room from the emergency department for anticipated bypass surgery.  Had previously discussed the patient's case with our pharmacist, heparin was started. Final Clinical Impression(s) / ED Diagnoses Final diagnoses:  Critical limb ischemia of right lower extremity (HCC)  CRITICAL CARE Performed by:  Gerhard Munch Total critical care time: 40 minutes Critical care time was exclusive of separately billable procedures and treating other patients. Critical care was necessary to treat or prevent imminent or life-threatening  deterioration. Critical care was time spent personally by me on the following activities: development of treatment plan with patient and/or surrogate as well as nursing, discussions with consultants, evaluation of patient's response to treatment, examination of patient, obtaining history from patient or surrogate, ordering and performing treatments and interventions, ordering and review of laboratory studies, ordering and review of radiographic studies, pulse oximetry and re-evaluation of patient's condition.    Gerhard Munch, MD 01/26/24 [phone]

## 2024-01-26 NOTE — Discharge Instructions (Addendum)
Concerning presentation on the right foot as there is mottling present and no pulse is palpable.  Given this we recommend urgent evaluation at the emergency room where arterial studies can be performed.

## 2024-01-26 NOTE — Anesthesia Procedure Notes (Signed)
Arterial Line Insertion Start/End2/01/2024 4:12 PM, 01/26/2024 4:22 PM Performed by: Val Eagle, MD  Patient location: Pre-op. Preanesthetic checklist: patient identified, IV checked, risks and benefits discussed, surgical consent, monitors and equipment checked, pre-op evaluation, timeout performed and anesthesia consent Left, radial was placed Catheter size: 20 G Hand hygiene performed  and maximum sterile barriers used   Attempts: 1 Procedure performed without using ultrasound guided technique. Following insertion, dressing applied. Post procedure assessment: normal and unchanged  Patient tolerated the procedure well with no immediate complications.

## 2024-01-26 NOTE — ED Triage Notes (Signed)
Pt went to UC for cold right foot. Pt states he had a gout flare up in his big toe, had multiple injections to right foot on Monday, since then his foot has been cold that radiates to his mid calf. No pulse felt or heard on doppler, foot is cold to touch. Big toe discolored

## 2024-01-26 NOTE — ED Provider Triage Note (Addendum)
Emergency Medicine Provider Triage Evaluation Note  Fred Green , a 48 y.o. male  was evaluated in triage.  Pt complains of right leg pain. Had joint injection for gout Monday, noted pain and decreased sensation starting Wednesday, worsening since. No sensation in the toes.  Review of Systems  Positive:  Negative:   Physical Exam  BP (!) 175/106   Pulse 96   Temp 98.2 F (36.8 C)   Resp 18   Ht 6\' 4"  (1.93 m)   Wt (!) 151.9 kg   SpO2 99%   BMI 40.76 kg/m  Gen:   Awake, no distress   Resp:  Normal effort  MSK:   Moves extremities without difficulty  Other:    Medical Decision Making  Medically screening exam initiated at 12:22 PM.  Appropriate orders placed.  Sami R Wenger was informed that the remainder of the evaluation will be completed by another provider, this initial triage assessment does not replace that evaluation, and the importance of remaining in the ED until their evaluation is complete.  Unable to find DP or PT pulses with doppler. Vascular surgery Dr. Randie Heinz consulted, they will see the patient.  Dr. Randie Heinz has seen the patient, states that he cannot palpate femoral pulses, needs heparin, CTA bifem, NPO for likely OR later today. Staff informed, patient needs next available room   Vear Clock 01/26/24 1230    Leila Schuff, Shawn Route, PA-C 01/26/24 1258    Silva Bandy, PA-C 01/26/24 1258

## 2024-01-27 ENCOUNTER — Inpatient Hospital Stay (HOSPITAL_COMMUNITY): Payer: Self-pay

## 2024-01-27 ENCOUNTER — Encounter (HOSPITAL_COMMUNITY): Payer: Self-pay | Admitting: Vascular Surgery

## 2024-01-27 DIAGNOSIS — I70221 Atherosclerosis of native arteries of extremities with rest pain, right leg: Secondary | ICD-10-CM

## 2024-01-27 LAB — CBC
HCT: 40.1 % (ref 39.0–52.0)
Hemoglobin: 13.3 g/dL (ref 13.0–17.0)
MCH: 29.7 pg (ref 26.0–34.0)
MCHC: 33.2 g/dL (ref 30.0–36.0)
MCV: 89.5 fL (ref 80.0–100.0)
Platelets: 323 10*3/uL (ref 150–400)
RBC: 4.48 MIL/uL (ref 4.22–5.81)
RDW: 12.9 % (ref 11.5–15.5)
WBC: 18.9 10*3/uL — ABNORMAL HIGH (ref 4.0–10.5)
nRBC: 0 % (ref 0.0–0.2)

## 2024-01-27 LAB — ECHOCARDIOGRAM COMPLETE
AR max vel: 2.38 cm2
AV Area VTI: 2.47 cm2
AV Area mean vel: 2.31 cm2
AV Mean grad: 3 mm[Hg]
AV Peak grad: 5.9 mm[Hg]
Ao pk vel: 1.21 m/s
Area-P 1/2: 5.88 cm2
Calc EF: 52 %
Height: 75.984 in
S' Lateral: 3.8 cm
Single Plane A2C EF: 53.8 %
Single Plane A4C EF: 48.6 %
Weight: 4867.76 [oz_av]

## 2024-01-27 LAB — GLUCOSE, CAPILLARY
Glucose-Capillary: 229 mg/dL — ABNORMAL HIGH (ref 70–99)
Glucose-Capillary: 194 mg/dL — ABNORMAL HIGH (ref 70–99)
Glucose-Capillary: 127 mg/dL — ABNORMAL HIGH (ref 70–99)
Glucose-Capillary: 119 mg/dL — ABNORMAL HIGH (ref 70–99)

## 2024-01-27 LAB — BASIC METABOLIC PANEL
Anion gap: 8 (ref 5–15)
BUN: 18 mg/dL (ref 6–20)
CO2: 27 mmol/L (ref 22–32)
Calcium: 8.8 mg/dL — ABNORMAL LOW (ref 8.9–10.3)
Chloride: 98 mmol/L (ref 98–111)
Creatinine, Ser: 1.02 mg/dL (ref 0.61–1.24)
GFR, Estimated: >60 mL/min (ref >60)
Glucose, Bld: 254 mg/dL — ABNORMAL HIGH (ref 70–99)
Potassium: 4.8 mmol/L (ref 3.5–5.1)
Sodium: 133 mmol/L — ABNORMAL LOW (ref 135–145)

## 2024-01-27 LAB — LIPID PANEL
Cholesterol: 215 mg/dL — ABNORMAL HIGH (ref 0–200)
HDL: 35 mg/dL — ABNORMAL LOW (ref >40)
LDL Cholesterol: 157 mg/dL — ABNORMAL HIGH (ref 0–99)
Total CHOL/HDL Ratio: 6.1 {ratio}
Triglycerides: 116 mg/dL (ref <150)
VLDL: 23 mg/dL (ref 0–40)

## 2024-01-27 LAB — HEMOGLOBIN A1C
Hgb A1c MFr Bld: 10.1 % — ABNORMAL HIGH (ref 4.8–5.6)
Mean Plasma Glucose: 243.17 mg/dL

## 2024-01-27 LAB — HEPARIN LEVEL (UNFRACTIONATED): Heparin Unfractionated: 0.11 [IU]/mL — ABNORMAL LOW (ref 0.30–0.70)

## 2024-01-27 MED ORDER — PERFLUTREN LIPID MICROSPHERE
1.0000 mL | INTRAVENOUS | Status: AC | PRN
  Administered 2024-01-27: 3 mL via INTRAVENOUS

## 2024-01-27 MED ORDER — ASPIRIN 81 MG PO TBEC
81.0000 mg | DELAYED_RELEASE_TABLET | Freq: Every day | ORAL | Status: DC
Start: 2024-01-27 — End: 2024-01-31
  Administered 2024-01-27 – 2024-01-31 (×5): 81 mg via ORAL
  Filled 2024-01-27 (×5): qty 1

## 2024-01-27 NOTE — Progress Notes (Addendum)
  Progress Note    01/27/2024 8:01 AM 1 Day Post-Op  Subjective:  no complaints    Vitals:   01/27/24 0323 01/27/24 0731  BP: (!) 143/65 120/85  Pulse: 93 94  Resp: 11 17  Temp: 98.3 F (36.8 C) 97.7 F (36.5 C)  SpO2: 100% 99%    Physical Exam: General:  laying in bed, NAD Lungs:  nonlabored Incisions:  right lateral calf fasciotomy site intact. Brisk muscle bleeding from right medial calf fasciotomy site, new WTD compressive bandage applied Extremities:  palpable right PT pulse   CBC    Component Value Date/Time   WBC 20.4 (H) 01/26/2024 2224   RBC 4.64 01/26/2024 2224   HGB 14.0 01/26/2024 2224   HCT 41.7 01/26/2024 2224   PLT 319 01/26/2024 2224   MCV 89.9 01/26/2024 2224   MCH 30.2 01/26/2024 2224   MCHC 33.6 01/26/2024 2224   RDW 12.9 01/26/2024 2224   LYMPHSABS 2.6 01/26/2024 1225   MONOABS 0.9 01/26/2024 1225   EOSABS 0.1 01/26/2024 1225   BASOSABS 0.1 01/26/2024 1225    BMET    Component Value Date/Time   NA 133 (L) 01/27/2024 0342   K 4.8 01/27/2024 0342   CL 98 01/27/2024 0342   CO2 27 01/27/2024 0342   GLUCOSE 254 (H) 01/27/2024 0342   BUN 18 01/27/2024 0342   CREATININE 1.02 01/27/2024 0342   CALCIUM 8.8 (L) 01/27/2024 0342   GFRNONAA >60 01/27/2024 0342   GFRAA >60 01/12/2016 0615    INR    Component Value Date/Time   INR 1.0 01/26/2024 1225     Intake/Output Summary (Last 24 hours) at 01/27/2024 0801 Last data filed at 01/27/2024 0701 Gross per 24 hour  Intake 1848.17 ml  Output 2125 ml  Net -276.83 ml      Assessment/Plan:  48 y.o. male is 1 day post op, s/p: RLE thromboembolectomy, right TPT endarterectomy, right SFA/pop stenting, RLE four compartment fasciotomies   -Patient's medial calf fasciotomy site has had muscle bleeding overnight and caused wound vac to malfunction -New compressive wet to dry dressing placed in medial calf fasciotomy site. Will keep this on until tomorrow -Echo from yesterday is pending for  thrombus workup -RLE well perfused with palpable PT pulse -Heparin currently on hold for bleeding. Will check CBC this morning -Will place on bedrest today for bleeding issues   Loel Dubonnet, PA-C [facility] [phone] 01/27/2024 8:01 AM  I have independently interviewed and examined patient and agree with PA assessment and plan above.  Pressure was held on the bleeding area of his fasciotomy site today.  He has a bounding posterior tibial pulse on the right now in the foot perfusion appears much better than preoperatively.  Fasciotomy sites will not be closable anytime soon and we will look at placing a wound VAC tomorrow.  Fynn Vanblarcom C. Randie Heinz, MD [facility] [place] Office: [phone] Pager: [phone]

## 2024-01-27 NOTE — Progress Notes (Signed)
Patient's wound vac stopped functioning, the incision bled through the dressing, MD on call notified. He advices to apply a ace wrap and if it doesn't work to stop the wound vac  and apply a wet to dry dressing.

## 2024-01-27 NOTE — Progress Notes (Signed)
PT Cancellation Note  Patient Details Name: Fred Green MRN: [id number] DOB: 1976-12-20   Cancelled Treatment:    Reason Eval/Treat Not Completed: Patient not medically ready (per vascular not to mobilize today)   Enedina Finner Eleanora Guinyard 01/27/2024, 8:35 AM Merryl Hacker, PT [facility] Office: [phone]

## 2024-01-27 NOTE — Progress Notes (Signed)
Attempted to see pt. Pt with wound vac malfunction and told not to mobilize today until vac is working. Will cancel for today and check back tomorrow as schedule permits.  Tory Emerald, [place] [phone]

## 2024-01-27 NOTE — Progress Notes (Signed)
PHARMACIST LIPID MONITORING   Fred Green is a 48 y.o. male admitted on 01/26/2024 with critical limb ischemia s/p RLE thromboembolectomy, SFA/pop stent, endarterectomy, and fasciotomies x4.  Pharmacy has been consulted to optimize lipid-lowering therapy with the indication of secondary prevention for clinical ASCVD.  Recent Labs:  Lipid Panel (last 6 months):   Lab Results  Component Value Date   CHOL 215 (H) 01/27/2024   TRIG 116 01/27/2024   HDL 35 (L) 01/27/2024   CHOLHDL 6.1 01/27/2024   VLDL 23 01/27/2024   LDLCALC 157 (H) 01/27/2024    Hepatic function panel (last 6 months):   Lab Results  Component Value Date   AST 24 01/26/2024   ALT 20 01/26/2024   ALKPHOS 89 01/26/2024   BILITOT 1.4 (H) 01/26/2024    SCr (since admission):   Serum creatinine: 1.02 mg/dL 16/10/96 0454 Estimated creatinine clearance: 134.4 mL/min  Current therapy and lipid therapy tolerance Current lipid-lowering therapy: new rosuvastatin 20 mg Previous lipid-lowering therapies (if applicable): none Documented or reported allergies or intolerances to lipid-lowering therapies (if applicable): none  Assessment:   Patient agrees with changes to lipid-lowering therapy.  He reports that he has not been eating well the past four months and is interesting in making dietary changes as well.    Plan:    1.Statin intensity (high intensity recommended for all patients regardless of the LDL):  No statin changes. The patient is already on a high intensity statin. (New start this admission)  2.Add ezetimibe (if any one of the following):   Not indicated at this time.  3.Refer to lipid clinic:   Yes - patient interested in diet counseling / medication optimization  4.Follow-up with:  Lipid clinic referral.  5.Follow-up labs after discharge:  Changes in lipid therapy were made. Check a lipid panel in 8-12 weeks then annually.     Trixie Rude, PharmD Clinical Pharmacist 01/27/2024  8:34 AM

## 2024-01-27 NOTE — Inpatient Diabetes Management (Signed)
Inpatient Diabetes Program Recommendations  AACE/ADA: New Consensus Statement on Inpatient Glycemic Control (2015)  Target Ranges:  Prepandial:   less than 140 mg/dL      Peak postprandial:   less than 180 mg/dL (1-2 hours)      Critically ill patients:  140 - 180 mg/dL   Lab Results  Component Value Date   GLUCAP 194 (H) 01/27/2024   HGBA1C 10.1 (H) 01/27/2024    Review of Glycemic Control  Diabetes history: DM 2 Outpatient Diabetes medications: was on metformin when diagnosed 1 year ago but stopped taking it Current orders for Inpatient glycemic control:  Novolog 0-20 units tid Metformin 500 mg bid  Inpatient Diabetes Program Recommendations:    -   Start 70/30 insulin 10 units bid  Discharge Recommendations: Intermediate acting recommendations: insulin isophane & regular (NOVOLIN 70/30 RELION PEN) ([facility] Only) TBD on day of discharge  Supply/Referral recommendations: Glucometer Test strips Lancet device Lancets Pen needles - standard Metformin 500 mg bid  Use Adult Diabetes Insulin Treatment Post Discharge order set.  Spoke with pt at bedside regarding A1c level of 10.1%. Pt reports 1 year ago when diagnosed he was placed on metformin but stopped taking it. Pt reports his wife also has diabetes. Discussed glucose and A1c goals. Discussed possible need for insulin at time of discharge. Pt is familiar with insulin injections with the insulin pen as his wife takes insulin at home. Discussed importance of glucose control on wound healing and prevent common complications associated with hyperglycemia. We discussed in detail diet modification and the utilization of exercise when able to control glucose trends at home. Pt desires insulin at this time at discharge for tight glucose control.  Will monitor glucose trends and titrate as needed.  Thanks,  Christena Deem RN, MSN, BC-ADM Inpatient Diabetes Coordinator Team Pager [phone] (8a-5p)

## 2024-01-27 NOTE — Progress Notes (Signed)
  Echocardiogram 2D Echocardiogram has been performed.  Fred Green Asa Fath 01/27/2024, 9:32 AM

## 2024-01-27 NOTE — Progress Notes (Signed)
Heparin stopped per Dr. Randie Heinz due to excess bleeding, Pharmacist notified.

## 2024-01-27 NOTE — Progress Notes (Signed)
The wound still not working, a wet to dry dressing is applied, we'll continue to monitor.

## 2024-01-28 ENCOUNTER — Telehealth (HOSPITAL_COMMUNITY): Payer: Self-pay | Admitting: Pharmacy Technician

## 2024-01-28 ENCOUNTER — Other Ambulatory Visit (HOSPITAL_COMMUNITY): Payer: Self-pay

## 2024-01-28 LAB — GLUCOSE, CAPILLARY
Glucose-Capillary: 129 mg/dL — ABNORMAL HIGH (ref 70–99)
Glucose-Capillary: 128 mg/dL — ABNORMAL HIGH (ref 70–99)
Glucose-Capillary: 147 mg/dL — ABNORMAL HIGH (ref 70–99)
Glucose-Capillary: 114 mg/dL — ABNORMAL HIGH (ref 70–99)

## 2024-01-28 LAB — CBC
HCT: 36.9 % — ABNORMAL LOW (ref 39.0–52.0)
Hemoglobin: 12.3 g/dL — ABNORMAL LOW (ref 13.0–17.0)
MCH: 29.6 pg (ref 26.0–34.0)
MCHC: 33.3 g/dL (ref 30.0–36.0)
MCV: 88.7 fL (ref 80.0–100.0)
Platelets: 285 10*3/uL (ref 150–400)
RBC: 4.16 MIL/uL — ABNORMAL LOW (ref 4.22–5.81)
RDW: 12.8 % (ref 11.5–15.5)
WBC: 13.1 10*3/uL — ABNORMAL HIGH (ref 4.0–10.5)
nRBC: 0 % (ref 0.0–0.2)

## 2024-01-28 LAB — HEPARIN LEVEL (UNFRACTIONATED): Heparin Unfractionated: <0.1 [IU]/mL — ABNORMAL LOW (ref 0.30–0.70)

## 2024-01-28 MED ORDER — INSULIN ASPART PROT & ASPART (70-30 MIX) 100 UNIT/ML ~~LOC~~ SUSP
10.0000 [IU] | Freq: Two times a day (BID) | SUBCUTANEOUS | Status: DC
Start: 2024-01-28 — End: 2024-01-28
  Filled 2024-01-28: qty 10

## 2024-01-28 MED ORDER — HEPARIN SODIUM (PORCINE) 5000 UNIT/ML IJ SOLN
5000.0000 [IU] | Freq: Three times a day (TID) | INTRAMUSCULAR | Status: DC
  Administered 2024-01-28 – 2024-01-31 (×9): 5000 [IU] via SUBCUTANEOUS
  Filled 2024-01-28 (×8): qty 1

## 2024-01-28 MED ORDER — INSULIN ASPART PROT & ASPART (70-30 MIX) 100 UNIT/ML ~~LOC~~ SUSP
10.0000 [IU] | Freq: Two times a day (BID) | SUBCUTANEOUS | Status: DC
  Administered 2024-01-28 – 2024-01-31 (×6): 10 [IU] via SUBCUTANEOUS
  Filled 2024-01-28: qty 10

## 2024-01-28 NOTE — Consult Note (Addendum)
 WOC Nurse Consult Note: Reason for Consult:Consult requested to re-apply the Vac dressing to the right leg wound.  Vascular team is following for assessment and plan of care and previously applied Vac was removed earlier this week related to bleeding.  Pt was medicated for pain prior to the procedure and tolerated with mod amt discomfort.  Full thickness wound to right anterior calf is beefy red, scant amt bloody drainage when previous dressing was removed, 20X5X.2cm. Staples intact above and below the wound.  Applied Mepitel contact layer to minimize pain and adherence to the site, then one piece black foam to cont suction.  WOC team will plan to change again on Thursday. Supplies left at the bedside.   Topical treatment orders provided for bedside nurses to perform as follows: WOC applied the Vac on 2/4 and will change the  Vac dressing to right leg again on THURS, then weekly on Mon/Thursday. IF VAC HAS A SIGNIFICANT AMOUNT OF BLOOD OR IF IT IS BLOCKING THE CANNISTER, PLEASE CALL VASCULAR TEAM FOR FURTHER PLAN OF CARE Thank-you,  Shawndrea Rutkowski Sherree MSN, RN, CWOCN, [place], CNS [phone]

## 2024-01-28 NOTE — Evaluation (Signed)
 Physical Therapy Evaluation Patient Details Name: Fred Green MRN: [id number] DOB: 1976/10/30 Today's Date: 01/28/2024  History of Present Illness  48 yo male admitted 2/2 with RLE ischemia s/p 4 compartment fasciotomy, thromboembolectomy, and endarterectomy with VAC. 2/3 vac stopped working with WTD dressing and mobility held. PMHx: GERD, gout  Clinical Impression  Pt very pleasant and eager to be able to get OOB due to primofit leaking. Pt normally independent, working, driving and caring for fiancee. Pt able to pivot to EOB with min assist and walk to door and back in room with limited pain and no increased blood in VAC. Pt educated for ROM, transfers and gait with limited strength and activity tolerance due to pain. Pt will benefit from acute therapy to maximize mobility, safety and independence.           If plan is discharge home, recommend the following: A little help with walking and/or transfers;A little help with bathing/dressing/bathroom;Assistance with cooking/housework;Assist for transportation   Can travel by private vehicle        Equipment Recommendations Rolling walker (2 wheels)  Recommendations for Other Services       Functional Status Assessment Patient has had a recent decline in their functional status and demonstrates the ability to make significant improvements in function in a reasonable and predictable amount of time.     Precautions / Restrictions Precautions Precautions: Other (comment) Precaution Comments: VAC      Mobility  Bed Mobility Overal bed mobility: Needs Assistance Bed Mobility: Supine to Sit     Supine to sit: Min assist, HOB elevated     General bed mobility comments: HOB 30 degrees, min assist and increased time to rise to sitting, pt with pain with initial RLE movement and dependent positioning    Transfers Overall transfer level: Needs assistance   Transfers: Sit to/from Stand Sit to Stand: Contact guard assist            General transfer comment: cues for hand placement and RLE positioning. Pt standing and sitting with NWB RLE    Ambulation/Gait Ambulation/Gait assistance: Contact guard assist Gait Distance (Feet): 30 Feet Assistive device: Rolling walker (2 wheels) Gait Pattern/deviations: Step-to pattern   Gait velocity interpretation: <1.8 ft/sec, indicate of risk for recurrent falls   General Gait Details: Pt initiated gait with NWB with progressiong to PWB on RLE, cues for sequence, safety and RW use  Stairs            Wheelchair Mobility     Tilt Bed    Modified Rankin (Stroke Patients Only)       Balance Overall balance assessment: Needs assistance   Sitting balance-Leahy Scale: Good     Standing balance support: Bilateral upper extremity supported, Reliant on assistive device for balance Standing balance-Leahy Scale: Poor Standing balance comment: RW in standing                             Pertinent Vitals/Pain Pain Assessment Pain Assessment: 0-10 Pain Score: 5  Pain Location: RLE with bed mobility and transition to sitting, no pain in standing Pain Descriptors / Indicators: Aching, Cramping, Guarding Pain Intervention(s): Limited activity within patient's tolerance, Monitored during session, Premedicated before session, Repositioned    Home Living Family/patient expects to be discharged to:: Private residence Living Arrangements: Spouse/significant other Available Help at Discharge: Family;Available PRN/intermittently (son stays with him, has autism, daughter is in high school) Type of Home: House Home Access:  Stairs to enter   [employer] of Steps: 1   Home Layout: One level Home Equipment: Toilet riser Additional Comments: pt caring for fiancee with cancer, works in a Neurosurgeon Prior Level of Function : Independent/Modified Independent;Driving;Working/employed                     Extremity/Trunk  Assessment   Upper Extremity Assessment Upper Extremity Assessment: Overall WFL for tasks assessed    Lower Extremity Assessment Lower Extremity Assessment: RLE deficits/detail RLE Deficits / Details: limited ROM due to pain    Cervical / Trunk Assessment Cervical / Trunk Assessment: Normal  Communication   Communication Communication: No apparent difficulties Cueing Techniques: Verbal cues  Cognition Arousal: Alert Behavior During Therapy: WFL for tasks assessed/performed Overall Cognitive Status: Within Functional Limits for tasks assessed                                          General Comments      Exercises     Assessment/Plan    PT Assessment Patient needs continued PT services  PT Problem List Decreased strength;Decreased range of motion;Decreased activity tolerance;Decreased balance;Decreased mobility;Decreased knowledge of use of DME       PT Treatment Interventions DME instruction;Gait training;Functional mobility training;Therapeutic activities;Therapeutic exercise;Patient/family education    PT Goals (Current goals can be found in the Care Plan section)  Acute Rehab PT Goals Patient Stated Goal: be able to walk, work PT Goal Formulation: With patient Time For Goal Achievement: 02/11/24 Potential to Achieve Goals: Good    Frequency Min 1X/week     Co-evaluation               AM-PAC PT 6 Clicks Mobility  Outcome Measure Help needed turning from your back to your side while in a flat bed without using bedrails?: A Little Help needed moving from lying on your back to sitting on the side of a flat bed without using bedrails?: A Little Help needed moving to and from a bed to a chair (including a wheelchair)?: A Little Help needed standing up from a chair using your arms (e.g., wheelchair or bedside chair)?: A Little Help needed to walk in hospital room?: A Little Help needed climbing 3-5 steps with a railing? : A Lot 6 Click  Score: 17    End of Session   Activity Tolerance: Patient tolerated treatment well Patient left: in chair;with call bell/phone within reach Nurse Communication: Mobility status PT Visit Diagnosis: Other abnormalities of gait and mobility (R26.89);Difficulty in walking, not elsewhere classified (R26.2);Pain Pain - Right/Left: Right Pain - part of body: Leg    Time: 8980-8954 PT Time Calculation (min) (ACUTE ONLY): 26 min   Charges:   PT Evaluation $PT Eval Moderate Complexity: 1 Mod PT Treatments $Gait Training: 8-22 mins PT General Charges $$ ACUTE PT VISIT: 1 Visit         Lenoard SQUIBB, PT Acute Rehabilitation Services Office: [phone]   Lenoard NOVAK Rache Klimaszewski 01/28/2024, 11:55 AM

## 2024-01-28 NOTE — Progress Notes (Addendum)
  Progress Note    01/28/2024 8:15 AM 2 Days Post-Op  Subjective:  feels okay this morning, having some shooting pains in the right leg    Vitals:   01/28/24 0414 01/28/24 0717  BP: 136/78 (!) 158/91  Pulse:    Resp:    Temp: 98.4 F (36.9 C) 98.5 F (36.9 C)  SpO2:  99%    Physical Exam: General:  laying in bed comfortably Cardiac:  regular Lungs:  nonlabored Incisions:  RLE lateral fasciotomy intact and dry. RLE medial fasciotomy site with slight bloody oozing from the muscle bed, new WTD bandage placed Extremities:  palpable right PT. Brisk right DP/PT doppler signals  CBC    Component Value Date/Time   WBC 13.1 (H) 01/28/2024 0726   RBC 4.16 (L) 01/28/2024 0726   HGB 12.3 (L) 01/28/2024 0726   HCT 36.9 (L) 01/28/2024 0726   PLT 285 01/28/2024 0726   MCV 88.7 01/28/2024 0726   MCH 29.6 01/28/2024 0726   MCHC 33.3 01/28/2024 0726   RDW 12.8 01/28/2024 0726   LYMPHSABS 2.6 01/26/2024 1225   MONOABS 0.9 01/26/2024 1225   EOSABS 0.1 01/26/2024 1225   BASOSABS 0.1 01/26/2024 1225    BMET    Component Value Date/Time   NA 133 (L) 01/27/2024 0342   K 4.8 01/27/2024 0342   CL 98 01/27/2024 0342   CO2 27 01/27/2024 0342   GLUCOSE 254 (H) 01/27/2024 0342   BUN 18 01/27/2024 0342   CREATININE 1.02 01/27/2024 0342   CALCIUM  8.8 (L) 01/27/2024 0342   GFRNONAA >60 01/27/2024 0342   GFRAA >60 01/12/2016 0615    INR    Component Value Date/Time   INR 1.0 01/26/2024 1225    No intake or output data in the 24 hours ending 01/28/24 0815    Assessment/Plan:  48 y.o. male is 2 days post op, s/p: RLE thromboembolectomy, right TPT endarterectomy, right SFA/pop stenting, RLE four compartment fasciotomies    -No issues overnight. Remained on bedrest to reduce bleeding issues from right lower leg -RLE well perfused with palpable PT pulse -Right lower lateral leg fasciotomy site intact and dry -Right lower medial leg fasciotomy site much more dry this morning.  Slight bloody ooze from the muscle bed. Muscle is healthy appearing. New WTD dressing placed -Low concern for embolic source of RLE thrombus. Will d/c heparin . Patient will stay on ASA, plavix , and subcutaneous heparin   -Will attempt to place a new wound vac on the medial fasciotomy site today. Okay to get out of bed with PT/OT once vac is on   Ahmed Holster, [facility] [phone] 01/28/2024 8:15 AM   I have independently interviewed and examined patient and agree with PA assessment and plan above.  Echo negative appears to be acute on chronic disease will initiate aspirin  and Plavix  due to patient having multiple Eluvia stents.  Subcu heparin  for now.  Continue PT OT and wound VAC as unlikely fasciotomy to be closed.  He has a palpable right posterior tibial pulse in the left groin is soft without hematoma.  Saryn Cherry C. Sheree, MD [facility] [place] Office: [phone] Pager: [phone]

## 2024-01-28 NOTE — Progress Notes (Signed)
 PT Cancellation Note  Patient Details Name: Fred Green MRN: [id number] DOB: 1976-09-08   Cancelled Treatment:    Reason Eval/Treat Not Completed: Patient not medically ready (pt remains on bedrest, awaiting VAc and mobility clearance)   Natilie Krabbenhoft B Mayana Irigoyen 01/28/2024, 6:52 AM Lenoard SQUIBB, PT Acute Rehabilitation Services Office: [phone]

## 2024-01-28 NOTE — Telephone Encounter (Signed)
 Patient Product/process Development Scientist completed.    The patient is insured through [employer]. Patient has [employer], may use a copay card, and/or apply for patient assistance if available.    Ran test claim for Novolin 70/30 Kwik Pen and the current 30 day co-pay is $81.93.   This test claim was processed through [facility]- copay amounts may vary at other pharmacies due to pharmacy/plan contracts, or as the patient moves through the different stages of their insurance plan.     Reyes Sharps, CPHT Pharmacy Technician III Certified Patient Advocate [facility] Patient Advocate Team Direct Number: [phone]  Fax: [phone]

## 2024-01-28 NOTE — Inpatient Diabetes Management (Signed)
Inpatient Diabetes Program Recommendations  AACE/ADA: New Consensus Statement on Inpatient Glycemic Control (2015)  Target Ranges:  Prepandial:   less than 140 mg/dL      Peak postprandial:   less than 180 mg/dL (1-2 hours)      Critically ill patients:  140 - 180 mg/dL   Lab Results  Component Value Date   GLUCAP 128 (H) 01/28/2024   HGBA1C 10.1 (H) 01/27/2024    Review of Glycemic Control  Diabetes history: DM 2 Outpatient Diabetes medications: was on metformin when diagnosed 1 year ago but stopped taking it Current orders for Inpatient glycemic control:  Novolog 0-20 units tid Metformin 500 mg bid  Inpatient Diabetes Program Recommendations:    -   Start 70/30 insulin 10 units bid  Discharge Recommendations: Intermediate acting recommendations: insulin isophane & regular (NOVOLIN 70/30 RELION PEN) ([facility] Only) TBD on day of discharge  Supply/Referral recommendations: Glucometer Test strips Lancet device Lancets Pen needles - standard Metformin 500 mg bid  Use Adult Diabetes Insulin Treatment Post Discharge order set.  Spoke with pt and wife at bedside reviewing lifestyle modifications and discussing 70/30 insulin to possibly use at home. Will see pt again tomorrow 2/5.   Note: 70/30 insulin not given this am first dose will be this evening 2/4. Pt not eating a lot this am, finally ate a 6 inch flatbread sandwich at lunch.  Thanks,  Christena Deem RN, MSN, BC-ADM Inpatient Diabetes Coordinator Team Pager [phone] (8a-5p)

## 2024-01-28 NOTE — Progress Notes (Signed)
 Occupational Therapy Treatment Patient Details Name: Fred Green MRN: [id number] DOB: [birth year] Today's Date: 01/28/2024   History of present illness 48 yo male admitted 2/2 with RLE ischemia s/p 4 compartment fasciotomy, thromboembolectomy, and endarterectomy with VAC. 2/3 vac stopped working with WTD dressing and mobility held. PMHx: GERD, gout   OT comments  Pt currently at min assist level overall for functional transfers and selfcare tasks sit to stand.  Slight increased HR up to 120 BPM with activity.  Increased pain with transitions sit to stand but improves once standing.  Pt lives with his significant other and children and will have intermittent assist.  Feel he will benefit from acute care OT at this time in order to increase ADL independence for return home.  Pt has access to a 3:1 already and his significant other is checking in about a tub bench.  Feel he will likely not need post acute OT but will update accordingly.        If plan is discharge home, recommend the following:  Assistance with cooking/housework;Assist for transportation;Help with stairs or ramp for entrance   Equipment Recommendations  None recommended by OT       Precautions / Restrictions Precautions Precautions: Other (comment) Precaution Comments: VAC Restrictions Weight Bearing Restrictions Per Provider Order: No       Mobility Bed Mobility Overal bed mobility: Needs Assistance Bed Mobility: Sit to Supine       Sit to supine: Supervision        Transfers Overall transfer level: Needs assistance   Transfers: Sit to/from Stand Sit to Stand: Min assist           General transfer comment: Min assist for sit to stand from the lower beside recliner.     Balance Overall balance assessment: Needs assistance Sitting-balance support: Single extremity supported Sitting balance-Leahy Scale: Good     Standing balance support: Bilateral upper extremity supported, Reliant on assistive  device for balance Standing balance-Leahy Scale: Poor Standing balance comment: Pt needs UE support with use of the RW in standing.                           ADL either performed or assessed with clinical judgement   ADL Overall ADL's : Needs assistance/impaired Eating/Feeding: Independent;Sitting   Grooming: Wash/dry hands;Wash/dry face;Sitting;Set up   Upper Body Bathing: Set up;Sitting   Lower Body Bathing: Minimal assistance;Sit to/from stand   Upper Body Dressing : Set up;Sitting   Lower Body Dressing: Minimal assistance;Sit to/from stand   Toilet Transfer: Minimal assistance;Ambulation;Rolling walker (2 wheels)   Toileting- Clothing Manipulation and Hygiene: Minimal assistance;Sit to/from stand       Functional mobility during ADLs: Minimal assistance;Rolling walker (2 wheels) General ADL Comments: Decreased ability to reach the RLE for dressing tasks secondary to increased pain and decreased flexibility.    Extremity/Trunk Assessment Upper Extremity Assessment Upper Extremity Assessment: Overall WFL for tasks assessed   Lower Extremity Assessment Lower Extremity Assessment: Defer to PT evaluation RLE Deficits / Details: limited ROM due to pain   Cervical / Trunk Assessment Cervical / Trunk Assessment: Normal    Vision Baseline Vision/History: 0 No visual deficits Ability to See in Adequate Light: 0 Adequate Patient Visual Report: No change from baseline Vision Assessment?: No apparent visual deficits   Perception Perception Perception: Not tested   Praxis Praxis Praxis: Not tested    Cognition Arousal: Alert Behavior During Therapy: [facility] for tasks assessed/performed Overall  Cognitive Status: Within Functional Limits for tasks assessed                                                     Pertinent Vitals/ Pain       Pain Assessment Pain Assessment: Faces Faces Pain Scale: Hurts little more Pain Location: right lower  leg Pain Descriptors / Indicators: Aching, Cramping, Discomfort Pain Intervention(s): Limited activity within patient's tolerance, Repositioned, Monitored during session  Home Living Family/patient expects to be discharged to:: Private residence Living Arrangements: Spouse/significant other Available Help at Discharge: Family;Available PRN/intermittently (son stays with him, has autism, daughter is in high school) Type of Home: House Home Access: Stairs to enter Secretary/administrator of Steps: 1   Home Layout: One level     Bathroom Shower/Tub: Chief Strategy Officer: Standard     Home Equipment: Toilet riser   Additional Comments: pt caring for fiancee with cancer, works in a Theatre Stage Manager 1X/week        Progress Toward Goals  OT Goals(current goals can now be found in the care plan section)     Acute Rehab OT Goals Patient Stated Goal: Pt did not state but agreeable to participate in OT OT Goal Formulation: With patient Time For Goal Achievement: 02/11/24 Potential to Achieve Goals: Good  Plan         AM-PAC OT 6 Clicks Daily Activity     Outcome Measure   Help from another person eating meals?: None Help from another person taking care of personal grooming?: A Little Help from another person toileting, which includes using toliet, bedpan, or urinal?: A Little Help from another person bathing (including washing, rinsing, drying)?: A Little Help from another person to put on and taking off regular upper body clothing?: A Little Help from another person to put on and taking off regular lower body clothing?: A Little 6 Click Score: 19    End of Session Equipment Utilized During Treatment: Gait belt  OT Visit Diagnosis: Unsteadiness on feet (R26.81);Other abnormalities of gait and mobility (R26.89);Muscle weakness (generalized) (M62.81);Pain Pain - Right/Left: Right Pain - part of body: Leg   Activity Tolerance Other  (comment) (Limited secondary to orders stating bed to chair only)   Patient Left in bed;with call bell/phone within reach;with bed alarm set   Nurse Communication Mobility status        Time: 8858-8784 OT Time Calculation (min): 34 min  Charges: OT General Charges $OT Visit: 1 Visit OT Evaluation $OT Eval Moderate Complexity: 1 Mod OT Treatments $Self Care/Home Management : 8-22 mins   Lynwood Constant, OTR/L Acute Rehabilitation Services  Office [phone] 01/28/2024

## 2024-01-29 ENCOUNTER — Other Ambulatory Visit (HOSPITAL_COMMUNITY): Payer: Self-pay

## 2024-01-29 LAB — GLUCOSE, CAPILLARY
Glucose-Capillary: 129 mg/dL — ABNORMAL HIGH (ref 70–99)
Glucose-Capillary: 122 mg/dL — ABNORMAL HIGH (ref 70–99)
Glucose-Capillary: 141 mg/dL — ABNORMAL HIGH (ref 70–99)
Glucose-Capillary: 82 mg/dL (ref 70–99)

## 2024-01-29 MED ORDER — GABAPENTIN 100 MG PO CAPS
200.0000 mg | ORAL_CAPSULE | Freq: Every day | ORAL | Status: DC
  Administered 2024-01-29 – 2024-01-30 (×2): 200 mg via ORAL
  Filled 2024-01-29 (×2): qty 2

## 2024-01-29 NOTE — Progress Notes (Signed)
 This morning when the patient got up on the side of the bed to use the urinal, he noticed blood dripping down his right leg from the top of the wound vac site.  On the top of the incision site blood is seen under the dressing over the foam site.  The wound vac is still working. Pulses are found with the doppler.  Informed the on call vascular surgeon, who said to wrap up the site with Kerlex and Ace wrap and to monitor the site for now.  Will continue to monitor.  Kristene Sotero BRAVO, RN

## 2024-01-29 NOTE — Progress Notes (Addendum)
  Progress Note    01/29/2024 7:56 AM 3 Days Post-Op  Subjective:  says he is having shooting pains in the right leg when he tries to sleep    Vitals:   01/28/24 2344 01/29/24 0407  BP: (!) 143/85 (!) 152/91  Pulse: (!) 106 (!) 108  Resp: 17 20  Temp: 98.3 F (36.8 C) 98.2 F (36.8 C)  SpO2: 98% 99%    Physical Exam: General:  laying in bed comfortably, NAD Cardiac:  regular Lungs:  nonlabored Incisions:  RLE lateral fasciotomy site intact and dry. RLE medial fasciotomy site with wound vac with good seal, SS output in canister. No further oozing from outside of the wound vac bandaging Extremities:  palpable right PT  CBC    Component Value Date/Time   WBC 13.1 (H) 01/28/2024 0726   RBC 4.16 (L) 01/28/2024 0726   HGB 12.3 (L) 01/28/2024 0726   HCT 36.9 (L) 01/28/2024 0726   PLT 285 01/28/2024 0726   MCV 88.7 01/28/2024 0726   MCH 29.6 01/28/2024 0726   MCHC 33.3 01/28/2024 0726   RDW 12.8 01/28/2024 0726   LYMPHSABS 2.6 01/26/2024 1225   MONOABS 0.9 01/26/2024 1225   EOSABS 0.1 01/26/2024 1225   BASOSABS 0.1 01/26/2024 1225    BMET    Component Value Date/Time   NA 133 (L) 01/27/2024 0342   K 4.8 01/27/2024 0342   CL 98 01/27/2024 0342   CO2 27 01/27/2024 0342   GLUCOSE 254 (H) 01/27/2024 0342   BUN 18 01/27/2024 0342   CREATININE 1.02 01/27/2024 0342   CALCIUM  8.8 (L) 01/27/2024 0342   GFRNONAA >60 01/27/2024 0342   GFRAA >60 01/12/2016 0615    INR    Component Value Date/Time   INR 1.0 01/26/2024 1225     Intake/Output Summary (Last 24 hours) at 01/29/2024 0756 Last data filed at 01/29/2024 0656 Gross per 24 hour  Intake 240 ml  Output 1200 ml  Net -960 ml      Assessment/Plan:  48 y.o. male is 3 days post op, s/p: RLE thromboembolectomy, right TPT endarterectomy, right SFA/pop stenting, RLE four compartment fasciotomies    -Feels fine this morning. Says he is still getting random shooting pains in the right leg. He says this keeps him  from falling into a deep sleep. We can try gabapentin  200mg  at bedtime -RLE warm and well perfused with palpable PT pulse -Lateral fasciotomy site is intact and dry with staples. Medial fasciotomy site with wound vac with good seal.  -We will plan for wound vac change at the bedside tomorrow morning. He will likely need to heal his fasciotomy site with his wound vac since it is unlikely to be closed primarily -Continue ASA, plavix , and subcutaneous heparin . Continue to mobilize as tolerated   Ahmed Holster, PA-C [facility] [phone] 01/29/2024 7:56 AM   I have independently interviewed and examined patient and agree with PA assessment and plan above.  Patient still does have edema in the right lower extremity but compartments are all soft and there is a wound VAC medially which is functioning today.  He is on aspirin  Plavix  and subcutaneous heparin  as this appears to be an acute on chronic process rather than embolic.  Wound VAC management as above.  Saunders Arlington C. Sheree, MD [facility] [place] Office: [phone] Pager: [phone]

## 2024-01-29 NOTE — Inpatient Diabetes Management (Signed)
Inpatient Diabetes Program Recommendations  AACE/ADA: New Consensus Statement on Inpatient Glycemic Control (2015)  Target Ranges:  Prepandial:   less than 140 mg/dL      Peak postprandial:   less than 180 mg/dL (1-2 hours)      Critically ill patients:  140 - 180 mg/dL   Lab Results  Component Value Date   GLUCAP 122 (H) 01/29/2024   HGBA1C 10.1 (H) 01/27/2024    Review of Glycemic Control  Diabetes history: DM 2 Outpatient Diabetes medications: was on metformin when diagnosed 1 year ago but stopped taking it Current orders for Inpatient glycemic control:  70/30 10 units bid Novolog 0-20 units tid Metformin 500 mg bid  Inpatient Diabetes Program Recommendations:    -   Increase 70/30 insulin 12 units bid -   Decrease Novolog Correction to 0-9 units tid + hs  Discharge Recommendations: Intermediate acting recommendations: insulin isophane & regular (NOVOLIN 70/30 RELION PEN) ([facility] Only) TBD on day of discharge  Supply/Referral recommendations: Glucometer Test strips Lancet device Lancets Pen needles - standard Metformin 500 mg bid  Use Adult Diabetes Insulin Treatment Post Discharge order set.  Novolin 70/30 copay $81.93.........  Would recommend: Humalog 75/25 from [employer] coupon for $35/month. Will give coupon to pt. Dose being the same as 70/30 when prescribing.  Breakfast Omlet  Pt ordered pizza and fruit for dinner. Spoke with pt at bedside regarding modifying in the future to have a salad in place of the fruit and maybe not eat all of the pizza depending on the size.     Thanks,  Christena Deem RN, MSN, BC-ADM Inpatient Diabetes Coordinator Team Pager [phone] (8a-5p)

## 2024-01-29 NOTE — Progress Notes (Signed)
 Around 1815 Pt noticed blood trickling from under dressing.  Rn unwrapped kerlix to assess.  MD on call paged.  Instructed to rewrap and try to get through the night as VAC dressing will be changed in the AM.  Pressure borders placed with folded 4x4s, double wrapped in kerlix.  Pt requested no ace wrap to prevent discomfort.  Leg elevated with heel off bed, Pt stated that feels great.  Will report to night RN

## 2024-01-30 LAB — GLUCOSE, CAPILLARY
Glucose-Capillary: 90 mg/dL (ref 70–99)
Glucose-Capillary: 102 mg/dL — ABNORMAL HIGH (ref 70–99)
Glucose-Capillary: 149 mg/dL — ABNORMAL HIGH (ref 70–99)

## 2024-01-30 LAB — CBC
HCT: 36.5 % — ABNORMAL LOW (ref 39.0–52.0)
Hemoglobin: 12.5 g/dL — ABNORMAL LOW (ref 13.0–17.0)
MCH: 30 pg (ref 26.0–34.0)
MCHC: 34.2 g/dL (ref 30.0–36.0)
MCV: 87.5 fL (ref 80.0–100.0)
Platelets: 350 10*3/uL (ref 150–400)
RBC: 4.17 MIL/uL — ABNORMAL LOW (ref 4.22–5.81)
RDW: 12.8 % (ref 11.5–15.5)
WBC: 13.3 10*3/uL — ABNORMAL HIGH (ref 4.0–10.5)
nRBC: 0 % (ref 0.0–0.2)

## 2024-01-30 MED ORDER — INSULIN ASPART 100 UNIT/ML IJ SOLN: 0.0000 [IU] | Freq: Every day | INTRAMUSCULAR | Status: DC

## 2024-01-30 MED ORDER — INSULIN ASPART 100 UNIT/ML IJ SOLN
0.0000 [IU] | Freq: Three times a day (TID) | INTRAMUSCULAR | Status: DC
Start: 2024-01-30 — End: 2024-01-31

## 2024-01-30 MED ORDER — HYDROMORPHONE HCL 1 MG/ML IJ SOLN
1.0000 mg | Freq: Once | INTRAMUSCULAR | Status: AC
  Administered 2024-01-30: 1 mg via INTRAVENOUS
  Filled 2024-01-30: qty 1

## 2024-01-30 NOTE — TOC Initial Note (Signed)
 Transition of Care (TOC) - Initial/Assessment Note  Rayfield Gobble RN, BSN Transitions of Care Unit 4E- RN Case Manager See Treatment Team for direct phone #   Patient Details  Name: Fred Green MRN: [id number] Date of Birth: 04-Mar-1976  Transition of Care [facility]) CM/SW Contact:    Fred Rayfield Hurst, RN Phone Number: 01/30/2024, 3:35 PM  Clinical Narrative:                 Noted wound VAC has been taken off and plan for wet-to-dry drsg changes. Orders placed  for HHRN/PT.   Pt is without insurance and will need to be approved under charity care for [facility] services. CM has reached out to covering [facility] agency for this week- [facility] to review.   Spoke with pt and wife at bedside to discuss [facility] and DME needs. Per pt he will have assistance with wound care on days that [facility] will not be there- voiced that he plans to ask his niece to assist. Explained to pt that he will need a teachable caregiver to assist as HH will only be able to provide RN visit 1-2x week. Pt voiced understanding.   Pt confirmed he will have transportation home, Address, phone # confirmed. Pt has no PCP- agreeable to CM setting him up with one of the clinics.   Pt also voiced that someone from [facility] came to see him regarding potential Medicaid. FC to follow up.   Pt confirmed he needs DME- RW for home as well- will get order for this and see if [facility] supervisor will approve LOG for DME need.   CM reviewed medications- pt would be eligible for [facility] assistance- would be best to send meds for discharge to [facility] pharmacy to assist. CM also discussed [facility] with which he was already familiar with as well as [facility] for insulin  needs.   1530- [facility] liaison has reviewed referral under [facility] care- [facility] has accepted referral after review- for RN/PT- liaison again indicated that RN visits will only be 1-2x week and family will need to be educated on wound care to do on days that [facility] is not in the home.   TOC will f/u on RW and  medication needs prior to discharge.     Expected Discharge Plan: Home w Home Health Services Barriers to Discharge: Continued Medical Work up   Patient Goals and CMS Choice Patient states their goals for this hospitalization and ongoing recovery are:: return home CMS Medicare.gov Compare Post Acute Care list provided to:: Patient Choice offered to / list presented to : Patient      Expected Discharge Plan and Services   Discharge Planning Services: CM Consult Post Acute Care Choice: Durable Medical Equipment, Home Health Living arrangements for the past 2 months: Single Family Home                 DME Arranged: Walker rolling         HH Arranged: RN, PT HH Agency: [facility] Date [facility] Agency Contacted: 01/30/24 Time HH Agency Contacted: 1500 Representative spoke with at [facility] Agency: Darleene  Prior Living Arrangements/Services Living arrangements for the past 2 months: Single Family Home Lives with:: Spouse Patient language and need for interpreter reviewed:: Yes Do you feel safe going back to the place where you live?: Yes      Need for Family Participation in Patient Care: Yes (Comment) Care giver support system in place?: Yes (comment)   Criminal Activity/Legal Involvement Pertinent to Current  Situation/Hospitalization: No - Comment as needed  Activities of Daily Living      Permission Sought/Granted Permission sought to share information with : Facility Industrial/product Designer granted to share information with : Yes, Verbal Permission Granted     Permission granted to share info w AGENCY: HH/DME        Emotional Assessment Appearance:: Appears stated age Attitude/Demeanor/Rapport: Engaged Affect (typically observed): Accepting Orientation: : Oriented to Self, Oriented to Place, Oriented to  Time, Oriented to Situation Alcohol / Substance Use: Not Applicable Psych Involvement: No (comment)  Admission diagnosis:  Vascular complication  [I99.9] Critical limb ischemia of right lower extremity (HCC) [I70.221] Patient Active Problem List   Diagnosis Date Noted   Vascular complication 01/26/2024   Critical limb ischemia of right lower extremity (HCC) 01/26/2024   Back pain 01/11/2016   Tobacco abuse 01/11/2016   Abnormal EKG 01/11/2016   GERD (gastroesophageal reflux disease)    Gastroesophageal reflux disease without esophagitis    PCP:  Pcp, No Pharmacy:   [id number] - [place], [place] - [address] [facility] [address] [place] [address] Phone: [phone] Fax: [phone]     Social Drivers of Health (SDOH) Social History: SDOH Screenings   Food Insecurity: Food Insecurity Present (01/27/2024)  Utilities: Patient Unable To Answer (01/27/2024)  Tobacco Use: High Risk (01/26/2024)   SDOH Interventions:     Readmission Risk Interventions     No data to display

## 2024-01-30 NOTE — Progress Notes (Addendum)
 Progress Note    01/30/2024 8:41 AM 4 Days Post-Op  Subjective: Feels fine this morning.  Says the gabapentin  helped him sleep    Vitals:   01/30/24 0305 01/30/24 0722  BP: 133/83 (!) 147/82  Pulse: 93 (!) 109  Resp: 14 18  Temp: 97.8 F (36.6 C) 97.9 F (36.6 C)  SpO2: 94% 91%    Physical Exam: General: Resting comfortably in bed Cardiac: Regular Lungs: Nonlabored Incisions: Right leg lateral fasciotomy site intact with staples.  Right leg medial fasciotomy site healing appropriately with healthy tissue present, moderate venous bleeding Extremities: Palpable right PT pulse.  Able to wiggle toes on the right   CBC    Component Value Date/Time   WBC 13.3 (H) 01/30/2024 0731   RBC 4.17 (L) 01/30/2024 0731   HGB 12.5 (L) 01/30/2024 0731   HCT 36.5 (L) 01/30/2024 0731   PLT 350 01/30/2024 0731   MCV 87.5 01/30/2024 0731   MCH 30.0 01/30/2024 0731   MCHC 34.2 01/30/2024 0731   RDW 12.8 01/30/2024 0731   LYMPHSABS 2.6 01/26/2024 1225   MONOABS 0.9 01/26/2024 1225   EOSABS 0.1 01/26/2024 1225   BASOSABS 0.1 01/26/2024 1225    BMET    Component Value Date/Time   NA 133 (L) 01/27/2024 0342   K 4.8 01/27/2024 0342   CL 98 01/27/2024 0342   CO2 27 01/27/2024 0342   GLUCOSE 254 (H) 01/27/2024 0342   BUN 18 01/27/2024 0342   CREATININE 1.02 01/27/2024 0342   CALCIUM  8.8 (L) 01/27/2024 0342   GFRNONAA >60 01/27/2024 0342   GFRAA >60 01/12/2016 0615    INR    Component Value Date/Time   INR 1.0 01/26/2024 1225     Intake/Output Summary (Last 24 hours) at 01/30/2024 0841 Last data filed at 01/30/2024 0600 Gross per 24 hour  Intake 240 ml  Output 800 ml  Net -560 ml      Assessment/Plan:  48 y.o. male is 4 days postop, s/p: RLE thromboembolectomy, right TPT endarterectomy, right SFA/pop stenting, RLE four compartment fasciotomies    -The patient is overall feeling better this morning.  He says the gabapentin  helped him sleep much better last night  without pain in the leg -Right lower extremity warm and well-perfused with a palpable PT pulse -Right leg lateral fasciotomy site is intact with staples -Wound VAC taken off of right leg medial fasciotomy site.  The patient has had continued issues with bleeding through the wound VAC, causing malfunction.  We will discontinue the wound VAC.  Unfortunately his fasciotomy site cannot be closed primarily.  We will try to heal his fasciotomy site with daily wet-to-dry dressing changes -The patient does not have insurance so home health may be difficult to obtain.  We will try to get home health for at least 3 times weekly wet-to-dry dressing changes of the right medial fasciotomy site.  We will also try to see if the patient has any friends or family that can help with dressing changes -Continue aspirin , Plavix , and subcutaneous heparin .  Continue to mobilize with PT/OT   Ahmed Holster, PA-C [facility] [phone] 01/30/2024 8:41 AM     I have interviewed and examined patient with PA and agree with assessment and plan above.  Palpable PT pulse on the right.  Right medial fasciotomy site healing well but will not be closable and will need to heal by secondary intention.  Pearle Wandler C. Sheree, MD [facility] [place] Office: [phone]  Pager: [phone]

## 2024-01-30 NOTE — Inpatient Diabetes Management (Addendum)
 Inpatient Diabetes Program Recommendations  AACE/ADA: New Consensus Statement on Inpatient Glycemic Control (2015)  Target Ranges:  Prepandial:   less than 140 mg/dL      Peak postprandial:   less than 180 mg/dL (1-2 hours)      Critically ill patients:  140 - 180 mg/dL   Lab Results  Component Value Date   GLUCAP 90 01/30/2024   HGBA1C 10.1 (H) 01/27/2024    Review of Glycemic Control  Latest Reference Range & Units 01/29/24 06:22 01/29/24 11:33 01/29/24 16:39 01/29/24 21:29 01/30/24 05:50  Glucose-Capillary 70 - 99 mg/dL 870 (H) 877 (H) 858 (H) 82 90   Diabetes history: DM 2 Outpatient Diabetes medications: was on metformin  when diagnosed 1 year ago but stopped taking it Current orders for Inpatient glycemic control:  70/30 10 units bid Novolog  0-20 units tid Metformin  500 mg bid  Inpatient Diabetes Program Recommendations:    -   Glucose looks great on 10 units of 70/30 -   Decrease Novolog  Correction to 0-6 units tid + hs  Discharge Recommendations: Intermediate acting recommendations: insulin  isophane & regular (NOVOLIN 70/30 RELION PEN) ([facility] Only) TBD on day of discharge  Supply/Referral recommendations: Glucometer Test strips Lancet device Lancets Pen needles - standard Metformin  500 mg bid  Use Adult Diabetes Insulin  Treatment Post Discharge order set.  Novolin 70/30 copay $81.93.........  Would recommend: Humalog  75/25 from [employer] coupon for $35/month. Will give coupon to pt. Dose being the same as 70/30 when prescribing.   Thanks,  Clotilda Bull RN, MSN, BC-ADM Inpatient Diabetes Coordinator Team Pager [phone] (8a-5p)

## 2024-01-30 NOTE — Progress Notes (Addendum)
 Occupational Therapy Treatment Patient Details Name: Fred Green MRN: [id number] DOB: 05-07-1976 Today's Date: 01/30/2024   History of present illness 48 yo male admitted 2/2 with RLE ischemia s/p 4 compartment fasciotomy, thromboembolectomy, and endarterectomy with VAC. 2/3 vac stopped working with WTD dressing and mobility held. PMHx: GERD, gout   OT comments  Pt currently min guard to supervision for selfcare tasks sit to stand and for transfers to the sink and bedside recliner from lowered EOB.  HR at around 100 BPM at rest increasing up to 122 BPM with standing and mobility.  Slight increased pain reported in the RLE.  Therapist loosened ace wrap and re-applied per pt request with report of decreased pain.  Pt is making great progress with OT at this time and will have intermittent assist from his fiance post discharge.  Anticipate no post acute OT follow-up needed.  Will continue to follow in acute care.      If plan is discharge home, recommend the following:  Assistance with cooking/housework;Assist for transportation;Help with stairs or ramp for entrance   Equipment Recommendations  None recommended by OT       Precautions / Restrictions Precautions Precautions: Fall Restrictions Weight Bearing Restrictions Per Provider Order: No       Mobility Bed Mobility Overal bed mobility: Modified Independent Bed Mobility: Supine to Sit     Supine to sit: Modified independent (Device/Increase time), HOB elevated, Used rails          Transfers Overall transfer level: Needs assistance Equipment used: Rolling walker (2 wheels) Transfers: Sit to/from Stand, Bed to chair/wheelchair/BSC Sit to Stand: Contact guard assist     Step pivot transfers: Contact guard assist     General transfer comment: Pt needed contact guard assist for sit to stand from the lowered EOB with min instructional cueing for hand placement.     Balance Overall balance assessment: Needs  assistance Sitting-balance support: Single extremity supported Sitting balance-Leahy Scale: Good     Standing balance support: Bilateral upper extremity supported, Reliant on assistive device for balance, During functional activity Standing balance-Leahy Scale: Poor Standing balance comment: Pt needs UE support on the RW for mobility and at least one hand on the sink during grooming tasks in standing.                           ADL either performed or assessed with clinical judgement   ADL Overall ADL's : Needs assistance/impaired     Grooming: Wash/dry face;Oral care;Supervision/safety;Standing           Upper Body Dressing : Supervision/safety   Lower Body Dressing: Sit to/from stand;Contact guard assist Lower Body Dressing Details (indicate cue type and reason): with integration of AE for donning gripper sock over the right foot Toilet Transfer: Contact guard assist;Ambulation;Rolling walker (2 wheels) Toilet Transfer Details (indicate cue type and reason): simulated Toileting- Clothing Manipulation and Hygiene: Contact guard assist;Sit to/from stand       Functional mobility during ADLs: Contact guard assist;Rolling walker (2 wheels) General ADL Comments: Pt's HR low 899d at rest increasing up to 122 BPM with activity.  Slight dizziness with initial standing but subsided before attempt to get BP.  Min guard for sit to stand from the lowered EOB with min instructional cueing for technique and hand placement and to notpull up on the RW with both hands.  Provided education and practice with use of the sockaide for donning RLE gripper sock.  Pt able to return demonstrate.  Discussed options for purchase.  Pt reports that he has a LH shoe horn and his fiance has a reacher already.      Cognition Arousal: Alert Behavior During Therapy: WFL for tasks assessed/performed Overall Cognitive Status: Within Functional Limits for tasks assessed                                                      Pertinent Vitals/ Pain       Pain Assessment Pain Assessment: Faces Faces Pain Scale: Hurts little more Pain Location: right lower leg Pain Descriptors / Indicators: Discomfort, Grimacing Pain Intervention(s): Limited activity within patient's tolerance, Repositioned         Frequency  Min 1X/week        Progress Toward Goals  OT Goals(current goals can now be found in the care plan section)  Progress towards OT goals: Progressing toward goals  Acute Rehab OT Goals Patient Stated Goal: Pt hopes to go home tomorrow and wants to get better and return to working. OT Goal Formulation: With patient Time For Goal Achievement: 02/11/24 Potential to Achieve Goals: Good  Plan         AM-PAC OT 6 Clicks Daily Activity     Outcome Measure     Help from another person taking care of personal grooming?: A Little Help from another person toileting, which includes using toliet, bedpan, or urinal?: A Little Help from another person bathing (including washing, rinsing, drying)?: A Little   Help from another person to put on and taking off regular lower body clothing?: A Little 6 Click Score: 12    End of Session Equipment Utilized During Treatment: Rolling walker (2 wheels)  OT Visit Diagnosis: Unsteadiness on feet (R26.81);Other abnormalities of gait and mobility (R26.89);Muscle weakness (generalized) (M62.81);Pain Pain - part of body: Leg   Activity Tolerance Patient tolerated treatment well   Patient Left in chair;with call bell/phone within reach   Nurse Communication Mobility status        Time: 9098-9067 OT Time Calculation (min): 31 min  Charges: OT General Charges $OT Visit: 1 Visit OT Treatments $Self Care/Home Management : 23-37 mins  Lynwood Constant, OTR/L Acute Rehabilitation Services  Office [phone] 01/30/2024

## 2024-01-30 NOTE — Consult Note (Addendum)
 WOC Nurse follow-up Consult Note: Vascular team following for left leg wound; according to nurses' notes, Vac was noted to have mod amt bloody drainage last night and was ordered to be discontinued.  Their team assessed the wound appearance this morning and has ordered moist gauze to be applied daily.   No further role for WOC nurses; please refer to vascular team for further questions regarding plan of care.   Please re-consult if further assistance is needed.  Thank-you,  Stephane Fought MSN, RN, CWOCN, [place], CNS [phone]

## 2024-01-30 NOTE — Progress Notes (Signed)
 Physical Therapy Treatment Patient Details Name: Fred Green MRN: [id number] DOB: Jun 14, 1976 Today's Date: 01/30/2024   History of Present Illness 48 yo male admitted 2/2 with RLE ischemia s/p 4 compartment fasciotomy, thromboembolectomy, and endarterectomy with VAC. 2/3 vac stopped working with WTD dressing and mobility held. PMHx: GERD, gout    PT Comments  Pt pleasant and very willing to mobilize to progress toward return home. Pt able to increase gait distance to hall ambulation, performing transfers without physical assist and educated for HEP. Pt with limited dorsiflexion due to weakness and pain as well as step to gait pattern. Updated D/C to OPPT with pt progression. Will continue to follow. Dressing remained intact without noted bleeding throughout session.     If plan is discharge home, recommend the following: A little help with walking and/or transfers;A little help with bathing/dressing/bathroom;Assistance with cooking/housework;Assist for transportation   Can travel by private vehicle        Equipment Recommendations  Rolling walker (2 wheels)    Recommendations for Other Services       Precautions / Restrictions Precautions Precautions: Fall Restrictions Weight Bearing Restrictions Per Provider Order: No     Mobility  Bed Mobility               General bed mobility comments: in chair on arrival and end of session    Transfers Overall transfer level: Needs assistance     Sit to Stand: Supervision           General transfer comment: pt able to stand and lower to recliner with cues for controlled descent and RLE positioning    Ambulation/Gait Ambulation/Gait assistance: Supervision Gait Distance (Feet): 375 Feet Assistive device: Rolling walker (2 wheels) Gait Pattern/deviations: Step-to pattern   Gait velocity interpretation: 1.31 - 2.62 ft/sec, indicative of limited community ambulator   General Gait Details: PWB with step to pattern able  to place RLE flat on floor with limited toe raise. Cues for heel strike and progression to step through pattern   Stairs             Wheelchair Mobility     Tilt Bed    Modified Rankin (Stroke Patients Only)       Balance Overall balance assessment: Mild deficits observed, not formally tested                                          Cognition Arousal: Alert Behavior During Therapy: WFL for tasks assessed/performed Overall Cognitive Status: Within Functional Limits for tasks assessed                                          Exercises General Exercises - Lower Extremity Long Arc Quad: AROM, Right, Seated, 20 reps, Strengthening Hip Flexion/Marching: AROM, Right, Seated, 10 reps, Strengthening Toe Raises: AROM, Right, Seated, 20 reps, Strengthening Other Exercises Other Exercises: dorsiflexion stretch 3x 20 sec hold, AAROM    General Comments        Pertinent Vitals/Pain Pain Assessment Pain Score: 2  Pain Location: RLE Pain Descriptors / Indicators: Discomfort, Grimacing Pain Intervention(s): Limited activity within patient's tolerance, Monitored during session, Repositioned    Home Living  Prior Function            PT Goals (current goals can now be found in the care plan section) Progress towards PT goals: Progressing toward goals    Frequency    Min 1X/week      PT Plan      Co-evaluation              AM-PAC PT 6 Clicks Mobility   Outcome Measure  Help needed turning from your back to your side while in a flat bed without using bedrails?: None Help needed moving from lying on your back to sitting on the side of a flat bed without using bedrails?: A Little Help needed moving to and from a bed to a chair (including a wheelchair)?: A Little Help needed standing up from a chair using your arms (e.g., wheelchair or bedside chair)?: A Little Help needed to walk in  hospital room?: A Little Help needed climbing 3-5 steps with a railing? : A Little 6 Click Score: 19    End of Session   Activity Tolerance: Patient tolerated treatment well Patient left: in chair;with call bell/phone within reach Nurse Communication: Mobility status PT Visit Diagnosis: Other abnormalities of gait and mobility (R26.89);Difficulty in walking, not elsewhere classified (R26.2);Pain     Time: 9060-8997 PT Time Calculation (min) (ACUTE ONLY): 23 min  Charges:    $Gait Training: 8-22 mins $Therapeutic Activity: 8-22 mins PT General Charges $$ ACUTE PT VISIT: 1 Visit                     Lenoard SQUIBB, PT Acute Rehabilitation Services Office: [phone]    [name] B [name] 01/30/2024, 10:08 AM

## 2024-01-31 ENCOUNTER — Other Ambulatory Visit (HOSPITAL_COMMUNITY): Payer: Self-pay

## 2024-01-31 LAB — GLUCOSE, CAPILLARY
Glucose-Capillary: 172 mg/dL — ABNORMAL HIGH (ref 70–99)
Glucose-Capillary: 117 mg/dL — ABNORMAL HIGH (ref 70–99)
Glucose-Capillary: 137 mg/dL — ABNORMAL HIGH (ref 70–99)

## 2024-01-31 MED ORDER — GABAPENTIN 100 MG PO CAPS
200.0000 mg | ORAL_CAPSULE | Freq: Every day | ORAL | 1 refills | Status: DC
  Filled 2024-01-31: qty 60, 30d supply, fill #0

## 2024-01-31 MED ORDER — METFORMIN HCL 500 MG PO TABS
500.0000 mg | ORAL_TABLET | Freq: Two times a day (BID) | ORAL | 3 refills | Status: AC
  Filled 2024-01-31 – 2024-03-04 (×2): qty 60, 30d supply, fill #0

## 2024-01-31 MED ORDER — INSULIN LISPRO PROT & LISPRO (75-25 MIX) 100 UNIT/ML KWIKPEN
10.0000 [IU] | PEN_INJECTOR | Freq: Two times a day (BID) | SUBCUTANEOUS | 0 refills | Status: DC
  Filled 2024-01-31: qty 15, 75d supply, fill #0

## 2024-01-31 MED ORDER — ROSUVASTATIN CALCIUM 20 MG PO TABS
20.0000 mg | ORAL_TABLET | Freq: Every day | ORAL | 11 refills | Status: DC
  Filled 2024-01-31 – 2024-03-04 (×2): qty 30, 30d supply, fill #0

## 2024-01-31 MED ORDER — ASPIRIN 81 MG PO TBEC
81.0000 mg | DELAYED_RELEASE_TABLET | Freq: Every day | ORAL | 12 refills | Status: AC
  Filled 2024-01-31 – 2024-03-04 (×2): qty 30, 30d supply, fill #0

## 2024-01-31 MED ORDER — INSULIN PEN NEEDLE 31G X 8 MM MISC
1.0000 | Freq: Three times a day (TID) | 0 refills | Status: DC
  Filled 2024-01-31: qty 100, 25d supply, fill #0

## 2024-01-31 MED ORDER — OXYCODONE HCL 5 MG PO TABS
5.0000 mg | ORAL_TABLET | ORAL | 0 refills | Status: DC | PRN
  Filled 2024-01-31: qty 24, 2d supply, fill #0

## 2024-01-31 MED ORDER — CLOPIDOGREL BISULFATE 75 MG PO TABS
75.0000 mg | ORAL_TABLET | Freq: Every day | ORAL | 11 refills | Status: AC
  Filled 2024-01-31 – 2024-03-04 (×2): qty 30, 30d supply, fill #0

## 2024-01-31 NOTE — Discharge Instructions (Signed)
   [facility]  Discharge instructions  Lower Extremity Surgery  Please refer to the following instruction for your post-procedure care. Your surgeon or physician assistant will discuss any changes with you.  Activity  You are encouraged to walk as much as you can. Avoid strenuous activity and heavy lifting until your doctor tells you it's OK. Avoid activities such as vacuuming or swinging a golf club. Do not drive until your doctor gives the OK and you are no longer taking prescription pain medications. It is also normal to have difficulty with sleep habits, eating and bowel movement after surgery. These will go away with time.  Bathing/Showering  You will have to take sponge baths instead of showers until your wound has healed.  Do not soak in a bathtub, hot tub, or swim until the incision heals completely.  Incision Care  Keep your wounds clean and bandaged as instructed by your doctor.  Diet  Resume your normal diet. There are no special food restrictions following this procedure. A low fat/ low cholesterol diet is recommended for all patients with vascular disease. In order to heal from your surgery, it is CRITICAL to get adequate nutrition. Your body requires vitamins, minerals, and protein. Vegetables are the best source of vitamins and minerals. Vegetables also provide the perfect balance of protein. Processed food has little nutritional value, so try to avoid this.  Medications  Resume taking all your medications unless your doctor or physician assistant tells you not to. If your incision is causing pain, you may take over-the-counter pain relievers such as acetaminophen  (Tylenol ). If you were prescribed a stronger pain medication, please aware these medication can cause nausea and constipation. Prevent nausea by taking the medication with a snack or meal. Avoid constipation by drinking plenty of fluids and eating foods with high amount of fiber,  such as fruits, vegetables, and grains. Take Colace 100 mg (an over-the-counter stool softener) twice a day as needed for constipation.     Follow Up  Our office will schedule a follow up appointment 2-3 weeks following discharge.  Please call us  immediately for any of the following conditions  - Severe or worsening pain in your legs or feet while at rest or while walking  - Increased pain, redness, warmth, or drainage (pus) from your incision site(s) - Fever of 101 degree or higher  Reduce your risk of vascular disease  Stop smoking. If you would like help call [facility] at 1-800-QUIT-NOW (613-810-1258) or [place] at [phone].  Manage your cholesterol Maintain a desired weight Control your diabetes weight Control your diabetes Keep your blood pressure down  If you have any questions, please call the office at [phone]

## 2024-01-31 NOTE — Inpatient Diabetes Management (Addendum)
 Inpatient Diabetes Program Recommendations  AACE/ADA: New Consensus Statement on Inpatient Glycemic Control (2015)  Target Ranges:  Prepandial:   less than 140 mg/dL      Peak postprandial:   less than 180 mg/dL (1-2 hours)      Critically ill patients:  140 - 180 mg/dL   Lab Results  Component Value Date   GLUCAP 117 (H) 01/31/2024   HGBA1C 10.1 (H) 01/27/2024    Review of Glycemic Control  Latest Reference Range & Units 01/30/24 05:50 01/30/24 11:34 01/30/24 16:19 01/30/24 20:03 01/31/24 06:11  Glucose-Capillary 70 - 99 mg/dL 90 897 (H) 827 (H) 850 (H) 117 (H)   Diabetes history: DM 2 Outpatient Diabetes medications: was on metformin  when diagnosed 1 year ago but stopped taking it Current orders for Inpatient glycemic control:  70/30 10 units bid Novolog  0-6 units tid + hs Metformin  500 mg bid   Discharge Recommendations: Other recommendations: Will give pt lilly coupon to get Humalog  75/25 insulin  for $35/month, also okay to continue metformin  dose as ordered inpatient. Will give glucometer to pt Intermediate acting recommendations: Insulin  Lispro Prot & Lispro (HUMALOG  75/25) Kwikpen 10 units bid  Supply/Referral recommendations: Pen needles - standard   Use Adult Diabetes Insulin  Treatment Post Discharge order set.  Gave pt glucometer via [employer]. Showed pt how to use it. Discussed insulin  prescribed at discharge, gave pt coupon for Humalog  75/25 kwik pen. Pt has no further questions at this time!   Thanks,  Clotilda Bull RN, MSN, BC-ADM Inpatient Diabetes Coordinator Team Pager [phone] (8a-5p)

## 2024-01-31 NOTE — TOC Transition Note (Signed)
 Transition of Care (TOC) - Discharge Note Rayfield Gobble RN, BSN Transitions of Care Unit 4E- RN Case Manager See Treatment Team for direct phone #   Patient Details  Name: Fred Green MRN: [id number] Date of Birth: 1976-04-22  Transition of Care [facility]) CM/SW Contact:  Gobble Rayfield Hurst, RN Phone Number: 01/31/2024, 12:02 PM   Clinical Narrative:    CM spoke with [facility] supervisor Erminio Herring this am regarding LOG for RW.  RW has been approved for LOG- form has been emailed to upper leadership to send to [employer].  CM spoke with [employer] liaison for DME RW need under LOG- liaison to process and RW to be delivered to room prior to discharge.   HHRN/PT has been set up with [facility] under [facility] they will contact pt to schedule.   CM spoke with pt at bedside- updated on DME and HH arrangements. CM also confirmed address as still working on PCP follow up and TOC will mail pt info on follow up appointment. Pt voiced understanding.   MATCH letter completed and placed in epic for medication assistance, requested scripts be sent to [facility] pharmacy for discharge.   Spouse to transport home once DME delivered and d/c order placed.    Final next level of care: Home w Home Health Services Barriers to Discharge: Barriers Resolved   Patient Goals and CMS Choice Patient states their goals for this hospitalization and ongoing recovery are:: return home CMS Medicare.gov Compare Post Acute Care list provided to:: Patient Choice offered to / list presented to : Patient      Discharge Placement               Home w/ [facility]        Discharge Plan and Services Additional resources added to the After Visit Summary for     Discharge Planning Services: CM Consult Post Acute Care Choice: Durable Medical Equipment, Home Health          DME Arranged: Walker rolling DME Agency: [employer] Date DME Agency Contacted: 01/31/24 Time DME Agency Contacted: 1030 Representative spoke with at DME  Agency: [facility] HH Arranged: RN, PT HH Agency: [facility] Date [facility] Agency Contacted: 01/30/24 Time HH Agency Contacted: 1500 Representative spoke with at [employer] Agency: [facility]  Social Drivers of Health (SDOH) Interventions SDOH Screenings   Food Insecurity: Food Insecurity Present (01/27/2024)  Utilities: Patient Unable To Answer (01/27/2024)  Tobacco Use: High Risk (01/26/2024)     Readmission Risk Interventions    01/31/2024   12:02 PM  Readmission Risk Prevention Plan  Post Dischage Appt Complete  Medication Screening Complete  Transportation Screening Complete

## 2024-01-31 NOTE — Progress Notes (Addendum)
 Progress Note    01/31/2024 7:36 AM 5 Days Post-Op  Subjective:  feels much better today. Says he has less pain in the right leg    Vitals:   01/30/24 2311 01/31/24 0414  BP: 131/82 135/83  Pulse: 98 90  Resp: 20 16  Temp: 98.2 F (36.8 C) 98.2 F (36.8 C)  SpO2: 97% 99%    Physical Exam: General:  sitting up in bed, alert and oriented x4 Cardiac:  regular Lungs:  nonlabored Incisions:  right lateral calf fasciotomy site intact and dry. Right medial calf with WTD dressing Extremities:  palpable right PT   CBC    Component Value Date/Time   WBC 13.3 (H) 01/30/2024 0731   RBC 4.17 (L) 01/30/2024 0731   HGB 12.5 (L) 01/30/2024 0731   HCT 36.5 (L) 01/30/2024 0731   PLT 350 01/30/2024 0731   MCV 87.5 01/30/2024 0731   MCH 30.0 01/30/2024 0731   MCHC 34.2 01/30/2024 0731   RDW 12.8 01/30/2024 0731   LYMPHSABS 2.6 01/26/2024 1225   MONOABS 0.9 01/26/2024 1225   EOSABS 0.1 01/26/2024 1225   BASOSABS 0.1 01/26/2024 1225    BMET    Component Value Date/Time   NA 133 (L) 01/27/2024 0342   K 4.8 01/27/2024 0342   CL 98 01/27/2024 0342   CO2 27 01/27/2024 0342   GLUCOSE 254 (H) 01/27/2024 0342   BUN 18 01/27/2024 0342   CREATININE 1.02 01/27/2024 0342   CALCIUM  8.8 (L) 01/27/2024 0342   GFRNONAA >60 01/27/2024 0342   GFRAA >60 01/12/2016 0615    INR    Component Value Date/Time   INR 1.0 01/26/2024 1225     Intake/Output Summary (Last 24 hours) at 01/31/2024 0736 Last data filed at 01/31/2024 0500 Gross per 24 hour  Intake 240 ml  Output 1400 ml  Net -1160 ml      Assessment/Plan:  47 y.o. male is 5 days post op, s/p:  RLE thromboembolectomy, right TPT endarterectomy, right SFA/pop stenting, RLE four compartment fasciotomies    -The patient continues to make good improvement in his mobility and pain control -RLE warm and well perfused with palpable PT pulse -Right lateral calf fasciotomy site intact with staples -Right medial calf with WTD  dressing. Will plan to change at bedside later this morning when family is present. Family will be helping take care of the patients fasciotomy site when [facility] is not present [facility] has been arranged for 1-2x weekly WTD dressing changes to the right medial fasciotomy site -Continue asa and plavix . Will plan to send home meds to [facility] pharmacy -Possible d/c home later today with 2-3 wk follow up with our office    Ahmed Holster, PA-C [facility] [phone] 01/31/2024 7:36 AM  I have independently interviewed and examined patient and agree with PA assessment and plan above.  Bounding right posterior tibial pulse.  I discussed the need for complete cessation from smoking and control of his blood glucose and he demonstrates good understanding.  Plan will be to discharge home with assistance from family with wound care and also twice weekly wet-to-dry dressings by home health.  He will follow-up in 2 to 3 weeks for wound check in our office.    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  Kia Varnadore C. Sheree, MD [facility] Office:  [phone] Pager: [phone]

## 2024-01-31 NOTE — Progress Notes (Signed)
 Mobility Specialist Progress Note:    01/31/24 0900  Mobility  Activity Ambulated with assistance in hallway  Level of Assistance Contact guard assist, steadying assist  Assistive Device Front wheel walker  Distance Ambulated (ft) 250 ft  Activity Response Tolerated well  Mobility Referral Yes  Mobility visit 1 Mobility  Mobility Specialist Start Time (ACUTE ONLY) 0900  Mobility Specialist Stop Time (ACUTE ONLY) 0910  Mobility Specialist Time Calculation (min) (ACUTE ONLY) 10 min   Pt received in bed, agreeable to mobility session. Ambulated in hallway with CGA via RW. Tolerated well, c/o RLE tightness which resolved during ambulation. Wife accompanied MS and pt during walk. VSS throughout. Returned pt to room with all needs met.    Pami Wool Mobility Specialist Please contact via Special Educational Needs Teacher or  Rehab office at [phone]

## 2024-01-31 NOTE — Progress Notes (Signed)
 Patient discharged from unit  medications and property returned to designated person. Discharge instructions reviewed and patient questions answered.

## 2024-01-31 NOTE — TOC CM/SW Note (Signed)
  MATCH Medication Assistance Card  Name: JAELIN FACKLER  ID (MRN): [id number] Ebony: [id number] RX Group: [id number] PCN: [id number] Person Code: 01 Relationship Code: 1-Cardholder  Discharge Date:  01/31/2024  Expiration Date: 02/08/2024  (must be filled within 7 days of discharge)

## 2024-01-31 NOTE — Plan of Care (Signed)
  Problem: Clinical Measurements: Goal: Will remain free from infection Outcome: Not Progressing   Problem: Clinical Measurements: Goal: Cardiovascular complication will be avoided Outcome: Not Progressing   Problem: Elimination: Goal: Will not experience complications related to bowel motility Outcome: Not Progressing   Problem: Skin Integrity: Goal: Risk for impaired skin integrity will decrease Outcome: Not Progressing   Problem: Tissue Perfusion: Goal: Adequacy of tissue perfusion will improve Outcome: Not Progressing

## 2024-02-03 ENCOUNTER — Telehealth: Payer: Self-pay

## 2024-02-03 DIAGNOSIS — I70221 Atherosclerosis of native arteries of extremities with rest pain, right leg: Secondary | ICD-10-CM

## 2024-02-03 NOTE — Addendum Note (Signed)
 Addended by: Courtenay Distel, Aleiah Mohammed A on: 02/03/2024 01:55 PM   Modules accepted: Orders

## 2024-02-03 NOTE — Telephone Encounter (Addendum)
 Pt called stating that his prescriptions were almost out. He didn't realize he would be in so much pain. He feels that he's been taking more than he should to help alleviate the pain.  Reviewed pt's chart, returned call for clarification, no answer, lf vm.  Caller: Patient  Concern: c/o burning at the big toe, pain below the shin that radiates up the leg, elevation makes pain worse  Location: right leg  Description:  worsening since a few days after surgery  Aggravating Factors: movement, walking, standing, sitting, lying down  Treatments:  Elevate when possible, take OTC Tylenol  and Advil   Procedure: Thrombectomy and fasciotomy  Consulted: Evone Hoh, PA  Resolution: Appointment scheduled for next available triage appt  Next Appt: Appointment scheduled for 02/03/24 @ 0800 for ABI and 0930 for PA

## 2024-02-04 ENCOUNTER — Ambulatory Visit (HOSPITAL_COMMUNITY)
Admission: RE | Admit: 2024-02-04 | Discharge: 2024-02-04 | Disposition: A | Discharge status: Home / Self Care | Payer: Medicaid Other | Source: Ambulatory Visit | Attending: Vascular Surgery | Admitting: Vascular Surgery

## 2024-02-04 ENCOUNTER — Ambulatory Visit (INDEPENDENT_AMBULATORY_CARE_PROVIDER_SITE_OTHER): Payer: Medicaid Other | Admitting: Physician Assistant

## 2024-02-04 VITALS — BP 121/68 | HR 104 | Temp 98.0°F | Resp 18 | Ht 76.0 in | Wt 270.0 lb

## 2024-02-04 DIAGNOSIS — I70221 Atherosclerosis of native arteries of extremities with rest pain, right leg: Secondary | ICD-10-CM | POA: Diagnosis present

## 2024-02-04 LAB — VAS US ABI WITH/WO TBI
Left ABI: 1.19
Right ABI: 1.15

## 2024-02-04 MED ORDER — OXYCODONE-ACETAMINOPHEN 5-325 MG PO TABS: 1.0000 | ORAL_TABLET | ORAL | 0 refills | Status: DC | PRN

## 2024-02-04 MED ORDER — GABAPENTIN 300 MG PO CAPS: 300.0000 mg | ORAL_CAPSULE | Freq: Three times a day (TID) | ORAL | 0 refills | Status: DC

## 2024-02-04 NOTE — Progress Notes (Signed)
POST OPERATIVE OFFICE NOTE    CC:  F/u for surgery  HPI:  Fred Green is a 48 y.o. male who presents today as a triage visit.  He recently underwent right lower extremity thromboembolectomy, right popliteal and tibioperoneal trunk endarterectomy, right popliteal and SFA stenting, and right lower extremity 4 compartment fasciotomies on 01/26/2024 by Dr. Randie Heinz.  This was done for acute on chronic right lower extremity limb threatening ischemia.  Postoperatively he did have some issues with neuropathic pain in the right lower extremity, however this was controlled with narcotic pain medication and gabapentin.  His right medial calf fasciotomy site could not be primarily closed due to lower extremity edema.  He was set up with [facility] for wet-to-dry dressing changes 1-2 times a week.  Family and friends will be performing dressing changes on the days that home health is not present.  The patient returns today for a triage visit.  He states over the past couple of days since going home he has developed worsening nerve pain in the right lower leg.  He has been having intermittent, severe burning and stabbing pains at the distal portion of his right medial calf fasciotomy site, medial ankle, and great toe.  This pain will come and go throughout the day no matter if he is walking, sitting, standing, or lying down.  He also states his right lower leg and foot started to swell a lot more since he went home.  He says he has spent more time moving around at home since his discharge.  He has also spent several hours throughout the day sitting in a chair watching TV.  He has tried to elevate his leg above his heart intermittently throughout the day, however he does not feel this helps his edema or pain.  He says the pain around his right medial ankle got worse last night when trying to elevate his leg.  His pain has subsided this morning. Overall he is having a hard time tolerating moderate compression with his Ace  wrap.  He is doing daily wet-to-dry dressing changes to his right medial calf fasciotomy site and denies any signs of infection.   He says the Roxicodone prescribed at discharge has not helped his pain.  He says the Percocet he received at the hospital helped his pain more.  He has been taking his gabapentin more than prescribed, anywhere between 500-800mg  daily for pain control.  He says he has also been doing warm Epsom salt soaks to his right foot for at least 2 hours a day.  He denies any fevers or chills.  He denies any decreased sensation or motor function in the right foot.   No Known Allergies  Current Outpatient Medications  Medication Sig Dispense Refill   aspirin EC 81 MG tablet Take 1 tablet (81 mg total) by mouth daily. Swallow whole. 30 tablet 12   clopidogrel (PLAVIX) 75 MG tablet Take 1 tablet (75 mg total) by mouth daily at 6 (six) AM. 30 tablet 11   gabapentin (NEURONTIN) 100 MG capsule Take 2 capsules (200 mg total) by mouth at bedtime. 60 capsule 1   ibuprofen (ADVIL,MOTRIN) 200 MG tablet Take 400 mg by mouth every 6 (six) hours as needed for mild pain (pain score 1-3) or moderate pain (pain score 4-6).     Insulin Lispro Prot & Lispro (HUMALOG 75/25 MIX) (75-25) 100 UNIT/ML Kwikpen Inject 10 Units into the skin 2 (two) times daily with a meal. 15 mL 0  Insulin Pen Needle 31G X 8 MM MISC Use as directed 3 (three) times daily. 100 each 0   metFORMIN (GLUCOPHAGE) 500 MG tablet Take 1 tablet (500 mg total) by mouth 2 (two) times daily with a meal. 60 tablet 3   oxyCODONE (ROXICODONE) 5 MG immediate release tablet Take 1-2 tablets (5-10 mg total) by mouth every 4 (four) hours as needed for severe pain (pain score 7-10). 24 tablet 0   rosuvastatin (CRESTOR) 20 MG tablet Take 1 tablet (20 mg total) by mouth daily. 30 tablet 11   No current facility-administered medications for this visit.     ROS:  See HPI  Physical Exam:   Incision: Right lateral calf fasciotomy site  healing appropriately.  Right medial calf fasciotomy site partially closed with staples intact.  Wound bed appears healthy with minimal bleeding, moderate serous drainage Extremities: Significant right lower leg and foot edema.  Calf compartments are soft.  Unable to palpate pedal pulses due to foot edema.  Brisk right DP/PT Doppler signals Neuro: Intact motor and sensation of the right foot       Assessment/Plan:  This is a 48 y.o. male who presents for a triage visit  -The patient recently underwent complex right lower extremity revascularization including right lower extremity thromboembolectomy, 4 compartment fasciotomies, popliteal endarterectomy, and popliteal/SFA stenting for acute on chronic limb ischemia -He states since his discharge he has been dealing with worsening stabbing and burning pain in the right lower extremity, specifically at the distal portion of his medial fasciotomy site, ankle, and great toe.  His pain will happen intermittently throughout the day no matter what position he is in.  He has tried to take extra gabapentin to help with his pain -He has also been experiencing worsening right lower extremity swelling since his discharge.  Admittedly he is spending more time on his feet at home and spends several hours a day sitting in a chair watching TV.  He states he does elevate his leg intermittently throughout the day, and does not experience much benefit from this.  He is also having a hard time tolerating moderate compression with his wet-to-dry dressing changes -He denies any symptoms of acute limb ischemia such as decreased motor or sensory function in the right foot.  His pedal pulses are difficult to palpate due to his edema, however he does have brisk DP and PT Doppler signals.  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  He has run out of his pain medication and states that oxycodone did not help at discharge.  He has also been taking more gabapentin than prescribed. -I have strongly encouraged the patient to elevate his legs much more throughout the day than he has been currently.  He should not spend several hours at 1 time sitting with his right foot in the dependent position.  I have encouraged him to try as much compression as he can tolerate with his wet-to-dry dressing changes of the right leg.  I have sent him out a new prescription of Percocet, since this helped him when he was in the hospital.  I will also increase his gabapentin dose to 300 mg 3 times daily -We will keep a close eye on the patient's symptoms to ensure that he  does not develop ischemia or rest pain symptoms.  He will continue daily wound care to the right medial fasciotomy site.  He can follow-up with our office in 2 weeks for repeat wound check   Loel Dubonnet, PA-C [facility] [phone]   Clinic MD:  Steve Rattler

## 2024-02-04 NOTE — Discharge Summary (Signed)
 Bypass Discharge Summary Patient ID: Fred Green [id number] 48 y.o. 02/22/1976  Admit date: 01/26/2024  Discharge date and time: 01/31/2024  1:51 PM   Admitting Physician: Penne Lonni [place], MD   Discharge Physician: Penne Lonni [place], MD   Admission Diagnoses: Vascular complication [I99.9] Critical limb ischemia of right lower extremity [facility]) [I70.221]  Discharge Diagnoses: Vascular complication [I99.9] Critical limb ischemia of right lower extremity (HCC) [I70.221]  Admission Condition: poor  Discharged Condition: fair  Indication for Admission: 48 year old male presented with acute on chronic right lower extremity limb threatening ischemia with CT evidence of small webbing in the right common femoral artery but 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 He was indicated for right lower extremity thromboembolectomy with possible bypass, possible right lower extremity angiogram and possible fasciotomies.   Hospital Course: The patient was admitted to the hospital on 01/26/2024 and underwent the following surgery: 1) right lower extremity thromboembolectomy via right below-knee popliteal artery exposure, 2) right popliteal and tibioperoneal trunk endarterectomy with bovine pericardial patch angioplasty, 3) right lower extremity angiogram with right popliteal and SFA stenting, and 4) right lower extremity 4 compartment fasciotomies with closure of lateral fasciotomy site and application of negative pressure dressing on medial fasciotomy site.  He tolerated the procedure well and was transferred to the PACU in stable condition.  POD1- The patient's medial calf fasciotomy site had brisk muscle bleeding overnight, causing the wound VAC to malfunction.  His heparin  was placed on hold.  His right medial calf fasciotomy site was dressed with a wet-to-dry dressing.  He was kept on bedrest.  He had an easily palpable right PT pulse.  His  hemoglobin was stable at 13.3.  He was to continue aspirin  and statin.  POD2- The patient's echo was negative for thrombus.  IV heparin  was discontinued.  He was placed on daily aspirin  and Plavix .  He was also placed on subcutaneous heparin .  His right medial calf fasciotomy site was much drier, and a new wound VAC dressing was placed.  He was cleared to mobilize with PT. He had an easily palpable right PT pulse.  His hemoglobin was stable at 12.3.  POD3- The patients wound vac had a good seal on his medial fasciotomy site. He was to continue aspirin , plavix , and statin. He continue to mobilize with PT. He had a palpable right PT pulse. He was having some nerve pain in the right foot at night, so gabapentin  200mg  was initiated at bedtime.   POD4- The patient's wound vac malfunctioned again overnight due to muscle bleeding from the fasciotomy site. His wound vac was discontinued and he was to receive daily WTD dressings for wound healing. His fasciotomy site could not be closed primarily due to edema.  He was still able to mobilize with PT.  He was to continue aspirin , statin, and Plavix .  His gabapentin  helped his nerve pain in the right leg.  We attempted to initiate home health services for wet-to-dry dressing changes at home.  POD5- The patient's right medial fasciotomy site wet-to-dry dressing was changed.  His fasciotomy wound bed appeared healthy.  He was mobilizing well with physical therapy.  His right lower extremity was well-perfused with a palpable PT pulse.  His pain was well-controlled on Percocet and gabapentin .  He was to continue on aspirin , statin, and Plavix .  [facility] was arranged for 1-2x weekly WTD dressing changes to the right leg. Family was to perform the other dressing changes.  He was to continue to elevate his right leg for edema.   The patient was mobilizing well and requiring minimal assistance.  He was tolerating a normal diet.  He was voiding without  difficulty.  He was discharged home on POD 5.    Consults: None  Treatments: IV hydration, antibiotics: Ancef , analgesia: acetaminophen , Dilaudid , and Percocet, anticoagulation: ASA, Plavix , heparin , and subcutaneous heparin , therapies: PT, OT, and RN, and surgery: 1) right lower extremity thromboembolectomy via right below-knee popliteal artery exposure, 2) right popliteal and tibioperoneal trunk endarterectomy with bovine pericardial patch angioplasty, 3) right lower extremity angiogram with right popliteal and SFA stenting, and 4) right lower extremity 4 compartment fasciotomies with closure of lateral fasciotomy site and application of negative pressure dressing on medial fasciotomy site on 01/26/2024    Disposition: Discharge disposition: 01-Home or Self Care       - For [facility] Registry use ---  Post-op:  Wound infection: No  Graft infection: No  Transfusion: No  If yes,  units given New Arrhythmia: No Patency judged by: [ ]  Dopper only, [ ]  Palpable graft pulse, [ x] Palpable distal pulse, [ ]  ABI inc. > 0.15, [ ]  Duplex D/C Ambulatory Status: Ambulatory  Complications: MI: [ x] No, [ ]  Troponin only, [ ]  EKG or Clinical CHF: No Resp failure: [ x] none, [ ]  Pneumonia, [ ]  Ventilator Chg in renal function: [x ] none, [ ]  Inc. Cr > 0.5, [ ]  Temp. Dialysis, [ ]  Permanent dialysis Stroke: [x ] None, [ ]  Minor, [ ]  Major Return to OR: No  Reason for return to OR: [ ]  Bleeding, [ ]  Infection, [ ]  Thrombosis, [ ]  Revision  Discharge medications: Statin use:  Yes ASA use:  Yes Plavix  use:  Yes Beta blocker use: No  for medical reason not indicated Coumadin  use: No  for medical reason not indicated    Patient Instructions:  Allergies as of 01/31/2024   No Known Allergies      Medication List     STOP taking these medications    predniSONE  5 MG tablet Commonly known as: DELTASONE        TAKE these medications    aspirin  EC 81 MG tablet Take 1 tablet (81 mg  total) by mouth daily. Swallow whole.   clopidogrel  75 MG tablet Commonly known as: PLAVIX  Take 1 tablet (75 mg total) by mouth daily at 6 (six) AM.   HumaLOG  Mix 75/25 KwikPen (75-25) 100 UNIT/ML KwikPen Generic drug: Insulin  Lispro Prot & Lispro Inject 10 Units into the skin 2 (two) times daily with a meal.   ibuprofen  200 MG tablet Commonly known as: ADVIL  Take 400 mg by mouth every 6 (six) hours as needed for mild pain (pain score 1-3) or moderate pain (pain score 4-6).   Insupen Pen Needles 31G X 8 MM Misc Generic drug: Insulin  Pen Needle Use as directed 3 (three) times daily.   metFORMIN  500 MG tablet Commonly known as: GLUCOPHAGE  Take 1 tablet (500 mg total) by mouth 2 (two) times daily with a meal.   rosuvastatin  20 MG tablet Commonly known as: CRESTOR  Take 1 tablet (20 mg total) by mouth daily.               Discharge Care Instructions  (From admission, onward)           Start     Ordered   01/31/24 0000  Discharge wound care:       Comments: Change the dressing  to your right lower leg once a day. Apply moist gauze dressings to the wound bed, then cover with a layer of dry gauze or an ABD pad. Then cover the leg with an ace wrap.   01/31/24 1309           Activity: activity as tolerated, no driving while on analgesics, no heavy lifting for 8 weeks, and elevate right leg when at rest Diet: diabetic diet and low fat, low cholesterol diet Wound Care:  Change the dressing to your right lower leg once a day. Apply moist gauze dressings to the wound bed, then cover with a layer of dry gauze or an ABD pad. Then cover the leg with an ace wrap  Follow-up with [facility] in 2 weeks.  SignedBETHA Ahmed Holster, PA-C 02/04/2024 10:20 AM

## 2024-02-05 ENCOUNTER — Telehealth: Payer: Self-pay

## 2024-02-05 NOTE — Telephone Encounter (Signed)
Home health:  Verbal orders given for [facility] RN x 3 visits.

## 2024-02-11 ENCOUNTER — Telehealth: Payer: Self-pay

## 2024-02-11 NOTE — Telephone Encounter (Signed)
 Triage: -pt called stating his pain had subsided but its been so bad lately, that he was thinking about going back to the hospital.  He describes the pain as shooting, that the gabapentin has helped especially since it was increased but the pain medicine is not really.  He denies any s/s of infection- he said they changed his bandaged early and they said it wasn't infected.  He states he can feel his pulse and his foot is warm.  That there is a little mushy area on top of his foot below his big toe.  -updated pt that we would not typically keep prescribing pain medicine, that if he was in that much pain then he would need to make an appt. If he lost his pulse, his leg/foot turned cold or discolored he should report to the ED.  Pt confirms understanding and will call in the am if he feels like his pain is not improving.

## 2024-02-14 ENCOUNTER — Ambulatory Visit (INDEPENDENT_AMBULATORY_CARE_PROVIDER_SITE_OTHER): Payer: Medicaid Other | Admitting: Physician Assistant

## 2024-02-14 VITALS — BP 128/88 | HR 105 | Temp 98.1°F | Ht 76.0 in | Wt 270.0 lb

## 2024-02-14 DIAGNOSIS — I70221 Atherosclerosis of native arteries of extremities with rest pain, right leg: Secondary | ICD-10-CM

## 2024-02-14 DIAGNOSIS — G8918 Other acute postprocedural pain: Secondary | ICD-10-CM

## 2024-02-14 DIAGNOSIS — Z9889 Other specified postprocedural states: Secondary | ICD-10-CM

## 2024-02-14 MED ORDER — OXYCODONE-ACETAMINOPHEN 5-325 MG PO TABS
1.0000 | ORAL_TABLET | Freq: Four times a day (QID) | ORAL | 0 refills | Status: DC | PRN
Start: 2024-02-14 — End: 2024-02-23

## 2024-02-14 NOTE — Progress Notes (Signed)
 Office Note     CC:  follow up Requesting Provider:  No ref. provider found  HPI: Fred Green is a 48 y.o. (1976-07-31) male who presents as a triage visit for continued pain in right leg. He is s/p right lower extremity thromboembolectomy, right popliteal and tibioperoneal trunk endarterectomy, right popliteal and SFA stenting, and right lower extremity 4 compartment fasciotomies on 01/26/2024 by Dr. Randie Heinz.  This was done for acute on chronic right lower extremity limb threatening ischemia.   He was recently seen on 2/11 as a triage work in for nerve pain as well in right leg. He is here today with his wife. He reports that sharp pains he was having at last visit have subsided with increase in Gabapentin dose but now having severe burning in toes and also in wound. He says it feels like someone is "holding a match on my skin". Leg is very painful and tight to bear any weight on or walk. He says it feels like the staples and skin is going to tear apart. He has been taking the Gabapentin, Tylenol pm and also Percocet. His pain has been so bad he says he has been taking two Percocet at a time because he was advised to take 2 if pain severe. He says pain stays 10/10. He continues to have swelling in right leg/ ankle. He reports that he has been elevating. Patients wife and [facility] RN are doing the daily dressing changes with wet- to dry. He is compliant with his aspirin, statin and Plavix.   Past Medical History:  Diagnosis Date   Diabetes mellitus without complication (HCC)    GERD (gastroesophageal reflux disease)    Gout    Tobacco abuse     Past Surgical History:  Procedure Laterality Date   FASCIOTOMY Right 01/26/2024   Procedure: RIGHT LOWER LEG FOUR COMPARTMENT FASCIOTOMY;  Surgeon: Maeola Harman, MD;  Location: [facility] OR;  Service: Vascular;  Laterality: Right;   INSERTION OF ILIAC STENT Right 01/26/2024   Procedure: INSERTION OF RIGHT POPITEAL ARTERY STENT;  Surgeon: Maeola Harman, MD;  Location: [facility] OR;  Service: Vascular;  Laterality: Right;   LOWER EXTREMITY ANGIOGRAM Right 01/26/2024   Procedure: RIGHT LOWER EXTREMITY ANGIOGRAM;  Surgeon: Maeola Harman, MD;  Location: [facility] OR;  Service: Vascular;  Laterality: Right;   NO PAST SURGERIES     PATCH ANGIOPLASTY Right 01/26/2024   Procedure: PATCH ANGIOPLASTY USING Kathleen Lime;  Surgeon: Maeola Harman, MD;  Location: [facility] OR;  Service: Vascular;  Laterality: Right;   THROMBECTOMY OF BYPASS GRAFT FEMORAL- POPLITEAL ARTERY Right 01/26/2024   Procedure: RIGHT LOWER EXTREMITY THROMBECTOMY;  Surgeon: Maeola Harman, MD;  Location: [facility] OR;  Service: Vascular;  Laterality: Right;    Social History   Socioeconomic History   Marital status: Significant Other    Spouse name: Not on file   Number of children: Not on file   Years of education: Not on file   Highest education level: Not on file  Occupational History   Not on file  Tobacco Use   Smoking status: Every Day    Types: Cigarettes   Smokeless tobacco: Not on file  Vaping Use   Vaping status: Never Used  Substance and Sexual Activity   Alcohol use: No   Drug use: Never   Sexual activity: Yes  Other Topics Concern   Not on file  Social History Narrative   Not on file   Social Drivers of Health  Financial Resource Strain: Not on file  Food Insecurity: Food Insecurity Present (01/27/2024)   Hunger Vital Sign    Worried About Running Out of Food in the Last Year: Sometimes true    Ran Out of Food in the Last Year: Not on file  Transportation Needs: Not on file  Physical Activity: Not on file  Stress: Not on file  Social Connections: Not on file  Intimate Partner Violence: Not on file    Family History  Problem Relation Age of Onset   Diabetes Mother    Stroke Mother    Cancer Mother    Heart failure Father    Heart attack Father     Current Outpatient Medications  Medication Sig Dispense Refill   aspirin  EC 81 MG tablet Take 1 tablet (81 mg total) by mouth daily. Swallow whole. 30 tablet 12   clopidogrel (PLAVIX) 75 MG tablet Take 1 tablet (75 mg total) by mouth daily at 6 (six) AM. 30 tablet 11   gabapentin (NEURONTIN) 300 MG capsule Take 1 capsule (300 mg total) by mouth 3 (three) times daily. 90 capsule 0   ibuprofen (ADVIL,MOTRIN) 200 MG tablet Take 400 mg by mouth every 6 (six) hours as needed for mild pain (pain score 1-3) or moderate pain (pain score 4-6).     Insulin Lispro Prot & Lispro (HUMALOG 75/25 MIX) (75-25) 100 UNIT/ML Kwikpen Inject 10 Units into the skin 2 (two) times daily with a meal. 15 mL 0   Insulin Pen Needle 31G X 8 MM MISC Use as directed 3 (three) times daily. 100 each 0   metFORMIN (GLUCOPHAGE) 500 MG tablet Take 1 tablet (500 mg total) by mouth 2 (two) times daily with a meal. 60 tablet 3   oxyCODONE-acetaminophen (PERCOCET/ROXICET) 5-325 MG tablet Take 1-2 tablets by mouth every 4 (four) hours as needed for severe pain (pain score 7-10). 25 tablet 0   rosuvastatin (CRESTOR) 20 MG tablet Take 1 tablet (20 mg total) by mouth daily. 30 tablet 11   No current facility-administered medications for this visit.    No Known Allergies   REVIEW OF SYSTEMS:  [X]  denotes positive finding, [ ]  denotes negative finding Cardiac  Comments:  Chest pain or chest pressure:    Shortness of breath upon exertion:    Short of breath when lying flat:    Irregular heart rhythm:        Vascular    Pain in calf, thigh, or hip brought on by ambulation:    Pain in feet at night that wakes you up from your sleep:     Blood clot in your veins:    Leg swelling:         Pulmonary    Oxygen at home:    Productive cough:     Wheezing:         Neurologic    Sudden weakness in arms or legs:     Sudden numbness in arms or legs:     Sudden onset of difficulty speaking or slurred speech:    Temporary loss of vision in one eye:     Problems with dizziness:         Gastrointestinal     Blood in stool:     Vomited blood:         Genitourinary    Burning when urinating:     Blood in urine:        Psychiatric    Major depression:  Hematologic    Bleeding problems:    Problems with blood clotting too easily:        Skin    Rashes or ulcers:        Constitutional    Fever or chills:      PHYSICAL EXAMINATION:  Vitals:   02/14/24 1352  BP: 128/88  Pulse: (!) 105  Temp: 98.1 F (36.7 C)  SpO2: 95%  Weight: 270 lb (122.5 kg)  Height: 6\' 4"  (1.93 m)    General:  WDWN in NAD; vital signs documented above Gait: Not observed, in wheel chair HENT: WNL, normocephalic Pulmonary: normal non-labored breathing Cardiac: tachycardic Vascular Exam/Pulses: Brisk doppler DP and PT signals Extremities: without ischemic changes, without Gangrene , without cellulitis; with open wound of medial fasciotomy site as shown below. Beefy red granulation tissue in wound bed. There is some areas along skin edge that are necrotic. I tried to trim these areas away. Patient only tolerated a little bit. I also removed the proximal most staple in the distal aspect of wound as it was sort of floating in wound bed   Musculoskeletal: no muscle wasting or atrophy  Neurologic: A&O X 3 Psychiatric:  The pt has Normal affect.  ASSESSMENT/PLAN:: 48 y.o. male here for triage work in for continued right leg pain. His pain is multifactorial. Most of his pain is from revascularization. He has heightened sensation in his leg and is having significant nerve pain with burning. Also pain from having open wound on the leg. He is s/p revascularization including right lower extremity thromboembolectomy, 4 compartment fasciotomies, popliteal endarterectomy, and popliteal/SFA stenting for acute on chronic limb ischemia on 01/26/24 by Dr. Randie Heinz. He has had some improvement of sharp pain with increase in Gabapentin dosing but now having severe burning pain in leg and toes.  - His right leg remains well  perfused with brisk DP and PT doppler signals - Attempted to debride some necrotic tissue from wound bed but he did not tolerate a lot of debridement in office. Advised him to take pain medication prior to his next visit so we can hopefully clean more of the tissue up. He may need to go back to OR for debridement in future if it progresses or unable to tolerate in office - Continue wet to dry dressing changes daily. HH and patients wife doing the dressing changes - Discussed that we cannot give him any stronger pain medication. I will refill this Percocet prescription today #30 no refills. He will return next week and will reassess need for further refills - Advised patient to alternate pain medication with Tylenol. He will also continued to take Gabapentin - Continue to elevate the leg and use Ace bandage to help with swelling - He has appointment already arranged on 02/19/24 so he will keep this appointment   Graceann Congress, PA-C [facility] [phone]  Clinic MD:   Hetty Blend

## 2024-02-19 ENCOUNTER — Ambulatory Visit (INDEPENDENT_AMBULATORY_CARE_PROVIDER_SITE_OTHER): Payer: Medicaid Other | Admitting: Physician Assistant

## 2024-02-19 ENCOUNTER — Other Ambulatory Visit: Payer: Self-pay

## 2024-02-19 ENCOUNTER — Encounter (HOSPITAL_COMMUNITY): Payer: Self-pay | Admitting: Vascular Surgery

## 2024-02-19 VITALS — BP 139/89 | HR 107 | Temp 98.8°F | Resp 20 | Ht 76.0 in | Wt 270.0 lb

## 2024-02-19 DIAGNOSIS — T8189XD Other complications of procedures, not elsewhere classified, subsequent encounter: Secondary | ICD-10-CM

## 2024-02-19 NOTE — Progress Notes (Signed)
 POST OPERATIVE OFFICE NOTE    CC:  F/u for surgery  HPI: Fred Green is a 48 y.o. male who presents today for a wound check.  He recently underwent right lower extremity thromboembolectomy, right popliteal and tibioperoneal trunk endarterectomy, right popliteal and SFA stenting, and right lower extremity 4 compartment fasciotomies on 01/26/2024 by Dr. Randie Heinz.  This was done for acute on chronic right lower extremity limb threatening ischemia.  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  He is also dealt with extensive right lower extremity swelling after his revascularization, which we are trying to treat with compression and leg elevation.  His right medial calf fasciotomy site could not be primarily closed due to lower extremity edema.  He was not able to get wound VAC approval for his fasciotomy site since he did not have insurance.  We have been trying to heal this area with wet-to-dry dressings.  At the patient's last visit, there was some necrosis at the edges of his muscle bed.  Debridement was attempted in the clinic, however the patient could only tolerate minimal debridement due to pain.   At today's visit the patient is doing okay.  He says that his fasciotomy site has stayed very sore since his office debridement.  He is continuing wet-to-dry dressings to this area.  He denies any puslike drainage or fevers.  He says that his neuropathic pain has greatly improved on gabapentin.  He denies any rest pain or tissue loss in the foot.  He also thinks that his leg swelling has greatly improved with leg elevation and compression.   No Known Allergies  Current Outpatient Medications  Medication Sig Dispense Refill   aspirin EC 81 MG tablet Take 1 tablet (81 mg total) by mouth daily. Swallow whole. 30 tablet 12   clopidogrel (PLAVIX) 75 MG tablet Take 1 tablet (75 mg total) by mouth daily at 6 (six) AM. 30 tablet 11    gabapentin (NEURONTIN) 300 MG capsule Take 1 capsule (300 mg total) by mouth 3 (three) times daily. 90 capsule 0   ibuprofen (ADVIL,MOTRIN) 200 MG tablet Take 400 mg by mouth every 6 (six) hours as needed for mild pain (pain score 1-3) or moderate pain (pain score 4-6).     Insulin Lispro Prot & Lispro (HUMALOG 75/25 MIX) (75-25) 100 UNIT/ML Kwikpen Inject 10 Units into the skin 2 (two) times daily with a meal. 15 mL 0   Insulin Pen Needle 31G X 8 MM MISC Use as directed 3 (three) times daily. 100 each 0   metFORMIN (GLUCOPHAGE) 500 MG tablet Take 1 tablet (500 mg total) by mouth 2 (two) times daily with a meal. 60 tablet 3   oxyCODONE-acetaminophen (PERCOCET/ROXICET) 5-325 MG tablet Take 1-2 tablets by mouth every 6 (six) hours as needed (severe pain (pain score 8-10)). 30 tablet 0   rosuvastatin (CRESTOR) 20 MG tablet Take 1 tablet (20 mg total) by mouth daily. 30 tablet 11   No current facility-administered medications for this visit.     ROS:  See HPI  Physical Exam:  Incision: Right lateral calf fasciotomy site healing okay, with some skin edge necrosis.  Right distal tibial wound due to open blister, healthy wound bed.  Right medial calf fasciotomy site healing okay centrally.  Continues to have necrotic skin edges medially with exquisite tenderness Extremities: Palpable right PT pulse Neuro: Intact motor and sensation of right lower extremity  Assessment/Plan:  This is a 48 y.o. male who is here for a wound check  -The patient presents today for a wound check.  At his last office visit, his right medial calf fasciotomy site was healing okay with some necrotic edges of the muscle bed.  The patient tolerated minimal debridement of these areas due to pain -At today's office visit he is doing okay.  He continues to have extreme soreness to the muscle bed.  He has continued daily wet-to-dry dressings to this area.  He denies any signs of infection or fevers.  He also denies any  rest pain or tissue loss -On exam his right lower extremity remains well-perfused with a 2+ PT pulse.  He has intact motor and sensation of the right lower extremity -His right lateral fasciotomy site is healing okay with some skin edge necrosis.  His right medial calf fasciotomy site is healing well centrally, however he continues to have necrotic skin edges medially.  He cannot tolerate debridement of this area in the office due to extreme pain.  The patient was also evaluated by Dr. Randie Heinz -The patient is hopeful to have a wound VAC placed in this area.  He states he has been able to obtain Medicaid -We will plan for right lower extremity medial fasciotomy site debridement and wound VAC placement with Dr. Hetty Blend tomorrow in the OR.  The patient will have to be admitted for home wound VAC approval and home health wound VAC changes.  He is agreeable to surgery   Loel Dubonnet, PA-C [facility] [phone]   Clinic MD:  Randie Heinz

## 2024-02-19 NOTE — H&P (View-Only) (Signed)
 POST OPERATIVE OFFICE NOTE    CC:  F/u for surgery  HPI: Fred Green is a 48 y.o. male who presents today for a wound check.  He recently underwent right lower extremity thromboembolectomy, right popliteal and tibioperoneal trunk endarterectomy, right popliteal and SFA stenting, and right lower extremity 4 compartment fasciotomies on 01/26/2024 by Fred Green.  This was done for acute on chronic right lower extremity limb threatening ischemia.  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  He is also dealt with extensive right lower extremity swelling after his revascularization, which we are trying to treat with compression and leg elevation.  His right medial calf fasciotomy site could not be primarily closed due to lower extremity edema.  He was not able to get wound VAC approval for his fasciotomy site since he did not have insurance.  We have been trying to heal this area with wet-to-dry dressings.  At the patient's last visit, there was some necrosis at the edges of his muscle bed.  Debridement was attempted in the clinic, however the patient could only tolerate minimal debridement due to pain.   At today's visit the patient is doing okay.  He says that his fasciotomy site has stayed very sore since his office debridement.  He is continuing wet-to-dry dressings to this area.  He denies any puslike drainage or fevers.  He says that his neuropathic pain has greatly improved on gabapentin.  He denies any rest pain or tissue loss in the foot.  He also thinks that his leg swelling has greatly improved with leg elevation and compression.   No Known Allergies  Current Outpatient Medications  Medication Sig Dispense Refill   aspirin EC 81 MG tablet Take 1 tablet (81 mg total) by mouth daily. Swallow whole. 30 tablet 12   clopidogrel (PLAVIX) 75 MG tablet Take 1 tablet (75 mg total) by mouth daily at 6 (six) AM. 30 tablet 11    gabapentin (NEURONTIN) 300 MG capsule Take 1 capsule (300 mg total) by mouth 3 (three) times daily. 90 capsule 0   ibuprofen (ADVIL,MOTRIN) 200 MG tablet Take 400 mg by mouth every 6 (six) hours as needed for mild pain (pain score 1-3) or moderate pain (pain score 4-6).     Insulin Lispro Prot & Lispro (HUMALOG 75/25 MIX) (75-25) 100 UNIT/ML Kwikpen Inject 10 Units into the skin 2 (two) times daily with a meal. 15 mL 0   Insulin Pen Needle 31G X 8 MM MISC Use as directed 3 (three) times daily. 100 each 0   metFORMIN (GLUCOPHAGE) 500 MG tablet Take 1 tablet (500 mg total) by mouth 2 (two) times daily with a meal. 60 tablet 3   oxyCODONE-acetaminophen (PERCOCET/ROXICET) 5-325 MG tablet Take 1-2 tablets by mouth every 6 (six) hours as needed (severe pain (pain score 8-10)). 30 tablet 0   rosuvastatin (CRESTOR) 20 MG tablet Take 1 tablet (20 mg total) by mouth daily. 30 tablet 11   No current facility-administered medications for this visit.     ROS:  See HPI  Physical Exam:  Incision: Right lateral calf fasciotomy site healing okay, with some skin edge necrosis.  Right distal tibial wound due to open blister, healthy wound bed.  Right medial calf fasciotomy site healing okay centrally.  Continues to have necrotic skin edges medially with exquisite tenderness Extremities: Palpable right PT pulse Neuro: Intact motor and sensation of right lower extremity  Assessment/Plan:  This is a 48 y.o. male who is here for a wound check  -The patient presents today for a wound check.  At his last office visit, his right medial calf fasciotomy site was healing okay with some necrotic edges of the muscle bed.  The patient tolerated minimal debridement of these areas due to pain -At today's office visit he is doing okay.  He continues to have extreme soreness to the muscle bed.  He has continued daily wet-to-dry dressings to this area.  He denies any signs of infection or fevers.  He also denies any  rest pain or tissue loss -On exam his right lower extremity remains well-perfused with a 2+ PT pulse.  He has intact motor and sensation of the right lower extremity -His right lateral fasciotomy site is healing okay with some skin edge necrosis.  His right medial calf fasciotomy site is healing well centrally, however he continues to have necrotic skin edges medially.  He cannot tolerate debridement of this area in the office due to extreme pain.  The patient was also evaluated by Fred Green -The patient is hopeful to have a wound VAC placed in this area.  He states he has been able to obtain Medicaid -We will plan for right lower extremity medial fasciotomy site debridement and wound VAC placement with Dr. Hetty Green tomorrow in the OR.  The patient will have to be admitted for home wound VAC approval and home health wound VAC changes.  He is agreeable to surgery   Fred Dubonnet, PA-C [facility] [phone]   Clinic MD:  Fred Green

## 2024-02-19 NOTE — Progress Notes (Signed)
 SDW CALL  Patient was given pre-op instructions over the phone. The opportunity was given for the patient to ask questions. No further questions asked. Patient verbalized understanding of instructions given.   PCP - [facility] ([address]) Cardiologist -   PPM/ICD - denies Device Orders - n/a Rep Notified - n/a  Chest x-ray - denies EKG - denies Stress Test - denies ECHO - 01-27-24 Cardiac Cath - denies  Sleep Study - denies   Fasting Blood Sugar - Per patient around 120-130 Checks Blood Sugar BID   Blood Thinner Instructions:Plavix last dose 02-19-24 Aspirin Instructions:continue  ERAS Protcol -NPO PRE-SURGERY Ensure or G2-   COVID TEST-    Anesthesia review:   Patient denies shortness of breath, fever, cough and chest pain over the phone call   All instructions explained to the patient, with a verbal understanding of the material. Patient agrees to go over the instructions while at home for a better understanding.

## 2024-02-20 ENCOUNTER — Inpatient Hospital Stay (HOSPITAL_COMMUNITY)
Admission: RE | Admit: 2024-02-20 | Discharge: 2024-02-24 | DRG: 264 | Disposition: A | Discharge status: Home Health | Payer: Medicaid Other | Attending: Vascular Surgery | Admitting: Vascular Surgery

## 2024-02-20 ENCOUNTER — Encounter (HOSPITAL_COMMUNITY): Payer: Self-pay | Admitting: Vascular Surgery

## 2024-02-20 ENCOUNTER — Encounter (HOSPITAL_COMMUNITY)
Admission: RE | Disposition: A | Discharge status: Home Health | Payer: Self-pay | Source: Home / Self Care | Attending: Vascular Surgery

## 2024-02-20 ENCOUNTER — Other Ambulatory Visit: Payer: Self-pay

## 2024-02-20 ENCOUNTER — Inpatient Hospital Stay (HOSPITAL_COMMUNITY): Payer: Medicaid Other | Admitting: Anesthesiology

## 2024-02-20 DIAGNOSIS — Y838 Other surgical procedures as the cause of abnormal reaction of the patient, or of later complication, without mention of misadventure at the time of the procedure: Secondary | ICD-10-CM | POA: Diagnosis present

## 2024-02-20 DIAGNOSIS — I739 Peripheral vascular disease, unspecified: Secondary | ICD-10-CM | POA: Diagnosis present

## 2024-02-20 DIAGNOSIS — Z7984 Long term (current) use of oral hypoglycemic drugs: Secondary | ICD-10-CM

## 2024-02-20 DIAGNOSIS — E119 Type 2 diabetes mellitus without complications: Secondary | ICD-10-CM

## 2024-02-20 DIAGNOSIS — E669 Obesity, unspecified: Secondary | ICD-10-CM | POA: Diagnosis present

## 2024-02-20 DIAGNOSIS — Z7982 Long term (current) use of aspirin: Secondary | ICD-10-CM | POA: Diagnosis not present

## 2024-02-20 DIAGNOSIS — I10 Essential (primary) hypertension: Secondary | ICD-10-CM | POA: Diagnosis present

## 2024-02-20 DIAGNOSIS — S81801A Unspecified open wound, right lower leg, initial encounter: Secondary | ICD-10-CM

## 2024-02-20 DIAGNOSIS — Z87891 Personal history of nicotine dependence: Secondary | ICD-10-CM

## 2024-02-20 DIAGNOSIS — Z6833 Body mass index (BMI) 33.0-33.9, adult: Secondary | ICD-10-CM

## 2024-02-20 DIAGNOSIS — Z794 Long term (current) use of insulin: Secondary | ICD-10-CM | POA: Diagnosis not present

## 2024-02-20 DIAGNOSIS — E1151 Type 2 diabetes mellitus with diabetic peripheral angiopathy without gangrene: Secondary | ICD-10-CM | POA: Diagnosis present

## 2024-02-20 DIAGNOSIS — I96 Gangrene, not elsewhere classified: Secondary | ICD-10-CM | POA: Diagnosis present

## 2024-02-20 DIAGNOSIS — B351 Tinea unguium: Secondary | ICD-10-CM | POA: Diagnosis present

## 2024-02-20 DIAGNOSIS — Y713 Surgical instruments, materials and cardiovascular devices (including sutures) associated with adverse incidents: Secondary | ICD-10-CM | POA: Diagnosis present

## 2024-02-20 DIAGNOSIS — Z79899 Other long term (current) drug therapy: Secondary | ICD-10-CM

## 2024-02-20 DIAGNOSIS — Z7902 Long term (current) use of antithrombotics/antiplatelets: Secondary | ICD-10-CM | POA: Diagnosis not present

## 2024-02-20 DIAGNOSIS — I9789 Other postprocedural complications and disorders of the circulatory system, not elsewhere classified: Principal | ICD-10-CM | POA: Diagnosis present

## 2024-02-20 DIAGNOSIS — T8189XD Other complications of procedures, not elsewhere classified, subsequent encounter: Secondary | ICD-10-CM

## 2024-02-20 HISTORY — PX: INCISION AND DRAINAGE OF WOUND: SHX1803

## 2024-02-20 HISTORY — PX: APPLICATION OF WOUND VAC: SHX5189

## 2024-02-20 LAB — POCT I-STAT, CHEM 8
BUN: 21 mg/dL — ABNORMAL HIGH (ref 6–20)
Calcium, Ion: 1.23 mmol/L (ref 1.15–1.40)
Chloride: 99 mmol/L (ref 98–111)
Creatinine, Ser: 1.1 mg/dL (ref 0.61–1.24)
Glucose, Bld: 126 mg/dL — ABNORMAL HIGH (ref 70–99)
HCT: 41 % (ref 39.0–52.0)
Hemoglobin: 13.9 g/dL (ref 13.0–17.0)
Potassium: 4.4 mmol/L (ref 3.5–5.1)
Sodium: 137 mmol/L (ref 135–145)
TCO2: 30 mmol/L (ref 22–32)

## 2024-02-20 LAB — CBC
HCT: 34.2 % — ABNORMAL LOW (ref 39.0–52.0)
Hemoglobin: 11.3 g/dL — ABNORMAL LOW (ref 13.0–17.0)
MCH: 29 pg (ref 26.0–34.0)
MCHC: 33 g/dL (ref 30.0–36.0)
MCV: 87.9 fL (ref 80.0–100.0)
Platelets: 695 10*3/uL — ABNORMAL HIGH (ref 150–400)
RBC: 3.89 MIL/uL — ABNORMAL LOW (ref 4.22–5.81)
RDW: 13 % (ref 11.5–15.5)
WBC: 11.1 10*3/uL — ABNORMAL HIGH (ref 4.0–10.5)
nRBC: 0 % (ref 0.0–0.2)

## 2024-02-20 LAB — HIV ANTIBODY (ROUTINE TESTING W REFLEX): HIV Screen 4th Generation wRfx: NONREACTIVE

## 2024-02-20 LAB — GLUCOSE, CAPILLARY
Glucose-Capillary: 118 mg/dL — ABNORMAL HIGH (ref 70–99)
Glucose-Capillary: 132 mg/dL — ABNORMAL HIGH (ref 70–99)
Glucose-Capillary: 114 mg/dL — ABNORMAL HIGH (ref 70–99)
Glucose-Capillary: 144 mg/dL — ABNORMAL HIGH (ref 70–99)

## 2024-02-20 LAB — PROTIME-INR
INR: 1.2 (ref 0.8–1.2)
Prothrombin Time: 15 s (ref 11.4–15.2)

## 2024-02-20 LAB — CREATININE, SERUM
Creatinine, Ser: 1.34 mg/dL — ABNORMAL HIGH (ref 0.61–1.24)
GFR, Estimated: >60 mL/min (ref >60)

## 2024-02-20 SURGERY — IRRIGATION AND DEBRIDEMENT WOUND
Anesthesia: General | Site: Leg Lower | Laterality: Right

## 2024-02-20 MED ORDER — HEPARIN SODIUM (PORCINE) 5000 UNIT/ML IJ SOLN
5000.0000 [IU] | Freq: Three times a day (TID) | INTRAMUSCULAR | Status: DC
  Administered 2024-02-21 – 2024-02-24 (×10): 5000 [IU] via SUBCUTANEOUS
  Filled 2024-02-20 (×10): qty 1

## 2024-02-20 MED ORDER — METOPROLOL TARTRATE 5 MG/5ML IV SOLN: 2.0000 mg | INTRAVENOUS | Status: DC | PRN

## 2024-02-20 MED ORDER — SODIUM CHLORIDE 0.9 % IV SOLN: INTRAVENOUS | Status: DC

## 2024-02-20 MED ORDER — GUAIFENESIN-DM 100-10 MG/5ML PO SYRP: 15.0000 mL | ORAL_SOLUTION | ORAL | Status: DC | PRN

## 2024-02-20 MED ORDER — KETOROLAC TROMETHAMINE 30 MG/ML IJ SOLN
INTRAMUSCULAR | Status: AC
  Filled 2024-02-20: qty 1

## 2024-02-20 MED ORDER — FENTANYL CITRATE (PF) 250 MCG/5ML IJ SOLN
INTRAMUSCULAR | Status: DC | PRN
  Administered 2024-02-20 (×2): 50 ug via INTRAVENOUS
  Administered 2024-02-20: 100 ug via INTRAVENOUS
  Administered 2024-02-20: 50 ug via INTRAVENOUS

## 2024-02-20 MED ORDER — ROSUVASTATIN CALCIUM 20 MG PO TABS
20.0000 mg | ORAL_TABLET | Freq: Every day | ORAL | Status: DC
  Administered 2024-02-21 – 2024-02-24 (×4): 20 mg via ORAL
  Filled 2024-02-20 (×4): qty 1

## 2024-02-20 MED ORDER — HYDROMORPHONE HCL 1 MG/ML IJ SOLN
0.2500 mg | INTRAMUSCULAR | Status: DC | PRN
  Administered 2024-02-20: 0.5 mg via INTRAVENOUS

## 2024-02-20 MED ORDER — ACETAMINOPHEN 325 MG PO TABS
ORAL_TABLET | ORAL | Status: AC
  Filled 2024-02-20: qty 2

## 2024-02-20 MED ORDER — ASPIRIN 81 MG PO TBEC
81.0000 mg | DELAYED_RELEASE_TABLET | Freq: Every day | ORAL | Status: DC
  Administered 2024-02-21 – 2024-02-24 (×4): 81 mg via ORAL
  Filled 2024-02-20 (×4): qty 1

## 2024-02-20 MED ORDER — CHLORHEXIDINE GLUCONATE 4 % EX SOLN
60.0000 mL | Freq: Once | CUTANEOUS | Status: DC
Start: 2024-02-21 — End: 2024-02-20

## 2024-02-20 MED ORDER — CHLORHEXIDINE GLUCONATE 4 % EX SOLN: 60.0000 mL | Freq: Once | CUTANEOUS | Status: DC

## 2024-02-20 MED ORDER — INSULIN LISPRO PROT & LISPRO (75-25 MIX) 100 UNIT/ML KWIKPEN: 10.0000 [IU] | PEN_INJECTOR | Freq: Two times a day (BID) | SUBCUTANEOUS | Status: DC

## 2024-02-20 MED ORDER — INSULIN ASPART 100 UNIT/ML IJ SOLN: 0.0000 [IU] | INTRAMUSCULAR | Status: DC | PRN

## 2024-02-20 MED ORDER — CLOPIDOGREL BISULFATE 75 MG PO TABS
75.0000 mg | ORAL_TABLET | Freq: Every day | ORAL | Status: DC
  Administered 2024-02-21 – 2024-02-24 (×4): 75 mg via ORAL
  Filled 2024-02-20 (×4): qty 1

## 2024-02-20 MED ORDER — LIDOCAINE 2% (20 MG/ML) 5 ML SYRINGE
INTRAMUSCULAR | Status: AC
  Filled 2024-02-20: qty 5

## 2024-02-20 MED ORDER — GABAPENTIN 300 MG PO CAPS
300.0000 mg | ORAL_CAPSULE | Freq: Three times a day (TID) | ORAL | Status: DC
  Administered 2024-02-20 – 2024-02-24 (×11): 300 mg via ORAL
  Filled 2024-02-20 (×11): qty 1

## 2024-02-20 MED ORDER — METFORMIN HCL 500 MG PO TABS
500.0000 mg | ORAL_TABLET | Freq: Two times a day (BID) | ORAL | Status: DC
  Administered 2024-02-20 – 2024-02-24 (×8): 500 mg via ORAL
  Filled 2024-02-20 (×9): qty 1

## 2024-02-20 MED ORDER — INSULIN ASPART PROT & ASPART (70-30 MIX) 100 UNIT/ML ~~LOC~~ SUSP
10.0000 [IU] | Freq: Two times a day (BID) | SUBCUTANEOUS | Status: DC
  Administered 2024-02-21 – 2024-02-24 (×7): 10 [IU] via SUBCUTANEOUS
  Filled 2024-02-20: qty 10

## 2024-02-20 MED ORDER — OXYCODONE HCL 5 MG/5ML PO SOLN: 5.0000 mg | Freq: Once | ORAL | Status: DC | PRN

## 2024-02-20 MED ORDER — MIDAZOLAM HCL 2 MG/2ML IJ SOLN
INTRAMUSCULAR | Status: AC
  Filled 2024-02-20: qty 2

## 2024-02-20 MED ORDER — ALUM & MAG HYDROXIDE-SIMETH 200-200-20 MG/5ML PO SUSP: 15.0000 mL | ORAL | Status: DC | PRN

## 2024-02-20 MED ORDER — ACETAMINOPHEN 500 MG PO TABS: 1000.0000 mg | ORAL_TABLET | Freq: Once | ORAL | Status: DC

## 2024-02-20 MED ORDER — PANTOPRAZOLE SODIUM 40 MG PO TBEC
40.0000 mg | DELAYED_RELEASE_TABLET | Freq: Every day | ORAL | Status: DC
  Administered 2024-02-24: 40 mg via ORAL
  Filled 2024-02-20 (×4): qty 1

## 2024-02-20 MED ORDER — HYDROMORPHONE HCL 1 MG/ML IJ SOLN
INTRAMUSCULAR | Status: AC
Start: 2024-02-20 — End: 2024-02-20
  Filled 2024-02-20: qty 1

## 2024-02-20 MED ORDER — CEFAZOLIN SODIUM-DEXTROSE 2-4 GM/100ML-% IV SOLN
2.0000 g | Freq: Three times a day (TID) | INTRAVENOUS | Status: AC
  Administered 2024-02-20: 2 g via INTRAVENOUS
  Filled 2024-02-20: qty 100

## 2024-02-20 MED ORDER — 0.9 % SODIUM CHLORIDE (POUR BTL) OPTIME
TOPICAL | Status: DC | PRN
  Administered 2024-02-20: 1000 mL

## 2024-02-20 MED ORDER — CHLORHEXIDINE GLUCONATE 0.12 % MT SOLN
OROMUCOSAL | Status: AC
Start: 2024-02-20 — End: 2024-02-20
  Administered 2024-02-20: 15 mL via OROMUCOSAL
  Filled 2024-02-20: qty 15

## 2024-02-20 MED ORDER — ONDANSETRON HCL 4 MG/2ML IJ SOLN: 4.0000 mg | Freq: Once | INTRAMUSCULAR | Status: DC | PRN

## 2024-02-20 MED ORDER — CEFAZOLIN SODIUM-DEXTROSE 3-4 GM/150ML-% IV SOLN
3.0000 g | INTRAVENOUS | Status: AC
  Administered 2024-02-20: 2 g via INTRAVENOUS

## 2024-02-20 MED ORDER — PHENYLEPHRINE 80 MCG/ML (10ML) SYRINGE FOR IV PUSH (FOR BLOOD PRESSURE SUPPORT)
PREFILLED_SYRINGE | INTRAVENOUS | Status: AC
  Filled 2024-02-20: qty 10

## 2024-02-20 MED ORDER — MIDAZOLAM HCL 2 MG/2ML IJ SOLN
INTRAMUSCULAR | Status: DC | PRN
  Administered 2024-02-20: 2 mg via INTRAVENOUS

## 2024-02-20 MED ORDER — PROPOFOL 10 MG/ML IV BOLUS
INTRAVENOUS | Status: AC
  Filled 2024-02-20: qty 20

## 2024-02-20 MED ORDER — PHENYLEPHRINE 80 MCG/ML (10ML) SYRINGE FOR IV PUSH (FOR BLOOD PRESSURE SUPPORT)
PREFILLED_SYRINGE | INTRAVENOUS | Status: DC | PRN
  Administered 2024-02-20 (×2): 80 ug via INTRAVENOUS

## 2024-02-20 MED ORDER — MORPHINE SULFATE (PF) 2 MG/ML IV SOLN
2.0000 mg | INTRAVENOUS | Status: DC | PRN
  Administered 2024-02-20 – 2024-02-21 (×2): 2 mg via INTRAVENOUS
  Filled 2024-02-20 (×2): qty 1

## 2024-02-20 MED ORDER — CEFAZOLIN SODIUM-DEXTROSE 2-4 GM/100ML-% IV SOLN
INTRAVENOUS | Status: AC
  Filled 2024-02-20: qty 100

## 2024-02-20 MED ORDER — VASHE WOUND IRRIGATION OPTIME
TOPICAL | Status: DC | PRN
  Administered 2024-02-20: 34 [oz_av]

## 2024-02-20 MED ORDER — CHLORHEXIDINE GLUCONATE 0.12 % MT SOLN: 15.0000 mL | Freq: Once | OROMUCOSAL | Status: AC

## 2024-02-20 MED ORDER — AMISULPRIDE (ANTIEMETIC) 5 MG/2ML IV SOLN
10.0000 mg | Freq: Once | INTRAVENOUS | Status: AC | PRN
  Administered 2024-02-20: 10 mg via INTRAVENOUS

## 2024-02-20 MED ORDER — SODIUM CHLORIDE 0.9 % IR SOLN
Status: DC | PRN
  Administered 2024-02-20: 1000 mL

## 2024-02-20 MED ORDER — GLYCOPYRROLATE PF 0.2 MG/ML IJ SOSY
PREFILLED_SYRINGE | INTRAMUSCULAR | Status: DC | PRN
Start: 2024-02-20 — End: 2024-02-20
  Administered 2024-02-20: .1 mg via INTRAVENOUS

## 2024-02-20 MED ORDER — KETOROLAC TROMETHAMINE 30 MG/ML IJ SOLN
30.0000 mg | Freq: Once | INTRAMUSCULAR | Status: AC
Start: 2024-02-20 — End: 2024-02-20
  Administered 2024-02-20: 30 mg via INTRAVENOUS

## 2024-02-20 MED ORDER — GLYCOPYRROLATE PF 0.2 MG/ML IJ SOSY
PREFILLED_SYRINGE | INTRAMUSCULAR | Status: AC
  Filled 2024-02-20: qty 1

## 2024-02-20 MED ORDER — OXYCODONE-ACETAMINOPHEN 5-325 MG PO TABS
1.0000 | ORAL_TABLET | ORAL | Status: DC | PRN
  Administered 2024-02-21: 2 via ORAL
  Administered 2024-02-21: 1 via ORAL
  Administered 2024-02-21 – 2024-02-24 (×8): 2 via ORAL
  Filled 2024-02-20 (×9): qty 2
  Filled 2024-02-20: qty 1

## 2024-02-20 MED ORDER — OXYCODONE HCL 5 MG PO TABS: 5.0000 mg | ORAL_TABLET | Freq: Once | ORAL | Status: DC | PRN

## 2024-02-20 MED ORDER — HYDROMORPHONE HCL 1 MG/ML IJ SOLN
INTRAMUSCULAR | Status: AC
  Filled 2024-02-20: qty 1

## 2024-02-20 MED ORDER — SODIUM CHLORIDE 0.9 % IV SOLN: INTRAVENOUS | Status: AC

## 2024-02-20 MED ORDER — LABETALOL HCL 5 MG/ML IV SOLN: 10.0000 mg | INTRAVENOUS | Status: DC | PRN

## 2024-02-20 MED ORDER — ONDANSETRON HCL 4 MG/2ML IJ SOLN: 4.0000 mg | Freq: Four times a day (QID) | INTRAMUSCULAR | Status: DC | PRN

## 2024-02-20 MED ORDER — ONDANSETRON HCL 4 MG/2ML IJ SOLN
INTRAMUSCULAR | Status: DC | PRN
  Administered 2024-02-20: 4 mg via INTRAVENOUS

## 2024-02-20 MED ORDER — PROPOFOL 10 MG/ML IV BOLUS
INTRAVENOUS | Status: DC | PRN
  Administered 2024-02-20: 200 mg via INTRAVENOUS

## 2024-02-20 MED ORDER — HYDRALAZINE HCL 20 MG/ML IJ SOLN: 5.0000 mg | INTRAMUSCULAR | Status: DC | PRN

## 2024-02-20 MED ORDER — INSULIN ASPART 100 UNIT/ML IJ SOLN
0.0000 [IU] | Freq: Three times a day (TID) | INTRAMUSCULAR | Status: DC
  Administered 2024-02-22: 2 [IU] via SUBCUTANEOUS

## 2024-02-20 MED ORDER — FENTANYL CITRATE (PF) 250 MCG/5ML IJ SOLN
INTRAMUSCULAR | Status: AC
Start: 2024-02-20 — End: ?
  Filled 2024-02-20: qty 5

## 2024-02-20 MED ORDER — LIDOCAINE 2% (20 MG/ML) 5 ML SYRINGE
INTRAMUSCULAR | Status: DC | PRN
  Administered 2024-02-20: 100 mg via INTRAVENOUS

## 2024-02-20 MED ORDER — ACETAMINOPHEN 325 MG PO TABS
650.0000 mg | ORAL_TABLET | Freq: Once | ORAL | Status: AC
  Administered 2024-02-20: 650 mg via ORAL

## 2024-02-20 MED ORDER — POTASSIUM CHLORIDE CRYS ER 20 MEQ PO TBCR: 20.0000 meq | EXTENDED_RELEASE_TABLET | Freq: Once | ORAL | Status: DC

## 2024-02-20 MED ORDER — ONDANSETRON HCL 4 MG/2ML IJ SOLN
INTRAMUSCULAR | Status: AC
  Filled 2024-02-20: qty 2

## 2024-02-20 MED ORDER — ORAL CARE MOUTH RINSE: 15.0000 mL | Freq: Once | OROMUCOSAL | Status: AC

## 2024-02-20 MED ORDER — AMISULPRIDE (ANTIEMETIC) 5 MG/2ML IV SOLN
INTRAVENOUS | Status: AC
  Filled 2024-02-20: qty 4

## 2024-02-20 MED ORDER — HYDROMORPHONE HCL 1 MG/ML IJ SOLN
0.2500 mg | INTRAMUSCULAR | Status: DC | PRN
  Administered 2024-02-20 (×4): 0.5 mg via INTRAVENOUS

## 2024-02-20 MED ORDER — PHENOL 1.4 % MT LIQD: 1.0000 | OROMUCOSAL | Status: DC | PRN

## 2024-02-20 SURGICAL SUPPLY — 41 items
BAG COUNTER SPONGE SURGICOUNT (BAG) ×1 IMPLANT
BNDG ELASTIC 4X5.8 VLCR STR LF (GAUZE/BANDAGES/DRESSINGS) IMPLANT
BNDG ELASTIC 6INX 5YD STR LF (GAUZE/BANDAGES/DRESSINGS) IMPLANT
BNDG GAUZE DERMACEA FLUFF 4 (GAUZE/BANDAGES/DRESSINGS) IMPLANT
CANISTER SUCT 3000ML PPV (MISCELLANEOUS) ×2 IMPLANT
CLIP TI MEDIUM 6 (CLIP) ×1 IMPLANT
CLIP TI WIDE RED SMALL 6 (CLIP) ×1 IMPLANT
COVER SURGICAL LIGHT HANDLE (MISCELLANEOUS) ×1 IMPLANT
DRAPE HALF SHEET 40X57 (DRAPES) IMPLANT
DRAPE U-SHAPE 76X120 STRL (DRAPES) IMPLANT
DRESSING VERAFLO CLEANS CC MED (GAUZE/BANDAGES/DRESSINGS) IMPLANT
DRSG VAC GRANUFOAM LG (GAUZE/BANDAGES/DRESSINGS) IMPLANT
DRSG VAC GRANUFOAM MED (GAUZE/BANDAGES/DRESSINGS) IMPLANT
DRSG VERAFLO CLEANSE CC MED (GAUZE/BANDAGES/DRESSINGS) ×1 IMPLANT
ELECT REM PT RETURN 9FT ADLT (ELECTROSURGICAL) ×1 IMPLANT
ELECTRODE REM PT RTRN 9FT ADLT (ELECTROSURGICAL) ×1 IMPLANT
GAUZE SPONGE 4X4 12PLY STRL (GAUZE/BANDAGES/DRESSINGS) ×1 IMPLANT
GAUZE XEROFORM 5X9 LF (GAUZE/BANDAGES/DRESSINGS) IMPLANT
GLOVE BIOGEL PI IND STRL 7.0 (GLOVE) ×1 IMPLANT
GOWN STRL REUS W/ TWL LRG LVL3 (GOWN DISPOSABLE) ×3 IMPLANT
GOWN STRL REUS W/ TWL XL LVL3 (GOWN DISPOSABLE) ×1 IMPLANT
GRAFT SKIN WND SURGICLOSE M95 (Tissue) IMPLANT
IV NS IRRIG 3000ML ARTHROMATIC (IV SOLUTION) ×1 IMPLANT
KIT BASIN OR (CUSTOM PROCEDURE TRAY) ×1 IMPLANT
KIT TURNOVER KIT B (KITS) ×1 IMPLANT
NS IRRIG 1000ML POUR BTL (IV SOLUTION) ×1 IMPLANT
PACK CV ACCESS (CUSTOM PROCEDURE TRAY) IMPLANT
PACK GENERAL/GYN (CUSTOM PROCEDURE TRAY) ×1 IMPLANT
PACK UNIVERSAL I (CUSTOM PROCEDURE TRAY) ×1 IMPLANT
PAD ARMBOARD 7.5X6 YLW CONV (MISCELLANEOUS) ×2 IMPLANT
PAD NEG PRESSURE SENSATRAC (MISCELLANEOUS) ×1 IMPLANT
POWDER SURGICEL 3.0 GRAM (HEMOSTASIS) IMPLANT
PULSAVAC PLUS IRRIG FAN TIP (DISPOSABLE) ×1 IMPLANT
SET HNDPC FAN SPRY TIP SCT (DISPOSABLE) IMPLANT
SUT ETHILON 3 0 PS 1 (SUTURE) IMPLANT
SUT VIC AB 2-0 CTX 36 (SUTURE) IMPLANT
SUT VIC AB 3-0 SH 27X BRD (SUTURE) IMPLANT
SUT VICRYL 4-0 PS2 18IN ABS (SUTURE) IMPLANT
TIP FAN IRRIG PULSAVAC PLUS (DISPOSABLE) IMPLANT
TOWEL GREEN STERILE (TOWEL DISPOSABLE) ×1 IMPLANT
WATER STERILE IRR 1000ML POUR (IV SOLUTION) ×1 IMPLANT

## 2024-02-20 NOTE — Interval H&P Note (Signed)
 History and Physical Interval Note:  02/20/2024 10:00 AM  Fred Green  has presented today for surgery, with the diagnosis of NON HEALING SURGICAL WOUND.  The various methods of treatment have been discussed with the patient and family. After consideration of risks, benefits and other options for treatment, the patient has consented to  Procedure(s): IRRIGATION AND DEBRIDEMENT WOUND RIGHT LEG (Right) APPLICATION OF WOUND VAC (Right) as a surgical intervention.  The patient's history has been reviewed, patient examined, no change in status, stable for surgery.  I have reviewed the patient's chart and labs.  Questions were answered to the patient's satisfaction.     Lemar Livings

## 2024-02-20 NOTE — Op Note (Signed)
    Patient name: Fred Green MRN: [id number] DOB: May 22, 1976 Sex: male  02/20/2024 Pre-operative Diagnosis: non-healing right lower extremity fasciotomy site with areas of necrosis Post-operative diagnosis:  Same Surgeon:  Luanna Salk. Randie Heinz, MD Assistants: Loel Dubonnet, PA; Filomena Jungling, MS3 Procedure Performed: 1.  Sharp excisional debridement of skin and subcutaneous tissue right lower extremity medial fasciotomy site to a total of 24 x 7 cm with pulse lavage 1 L normal saline and 1 L of Vashe fluid 2.  Application 95 cm Kerecis fish skin 3.  Application negative pressure dressing  Indications: 48 year old male status post right lower extremity revascularization of acute on chronic disease.  He is now maintained on aspirin and Plavix.  He has nonhealing medial fasciotomy site of the right lower extremity is indicated for debridement.  Assistants were necessary to facilitate debridement of the wound and placement of negative pressure dressing.  Findings: All of the tissue appeared viable after debridement and fish skin was applied and wound VAC applied after that and this achieved adequate suction.   Procedure:  The patient was identified in the holding area and taken to the operating room where is placed supine operative when general anesthesia was induced.  He was sterilely prepped and draped in the right lower extremity in usual fashion, antibiotics were administered and a timeout was called.  We began by debriding the necrotic areas of tissue particular on the posterior and deep aspects of the wound using scissors and cautery.  When all necrotic tissue was removed we then lavaged the wound with 1 laser of saline and 1 L of Vashe fluid.  There was adequate bleeding from all the tissue is all appeared viable after lavage.  We held pressure for a few minutes to obtain hemostasis and used cautery and a few areas.  We then applied 95 cm of Kerecis and then a blue foam was trimmed to size  placed over the Kerecis in the wound and a wound VAC was fashioned and connected to -125 mmHg suction.  Laterally we placed nonstick dressing and the right lower extremity was wrapped.  He was then awakened from anesthesia having tolerated the procedure without any complication.  All counts were correct at completion.   EBL: 50cc  Adhira Jamil C. Randie Heinz, MD [facility] [place] Office: [phone] Pager: [phone]

## 2024-02-20 NOTE — Progress Notes (Signed)
Patient brought to 4E from PACU. VSS. Telemetry box applied, CCMD notified. Patient oriented to room and staff. Call bell in reach.   Hannah Crill L Zavannah Deblois, RN  

## 2024-02-20 NOTE — Anesthesia Procedure Notes (Signed)
 Procedure Name: LMA Insertion Date/Time: 02/20/2024 10:45 AM  Performed by: Sharyn Dross, CRNAPre-anesthesia Checklist: Patient identified, Emergency Drugs available, Suction available and Patient being monitored Patient Re-evaluated:Patient Re-evaluated prior to induction Oxygen Delivery Method: Circle system utilized Preoxygenation: Pre-oxygenation with 100% oxygen Induction Type: IV induction Ventilation: Mask ventilation without difficulty LMA: LMA inserted LMA Size: 5.0 Tube type: Oral Number of attempts: 1 Placement Confirmation: positive ETCO2 and breath sounds checked- equal and bilateral Tube secured with: Tape Dental Injury: Teeth and Oropharynx as per pre-operative assessment

## 2024-02-20 NOTE — Anesthesia Preprocedure Evaluation (Addendum)
 Anesthesia Evaluation  Patient identified by MRN, date of birth, ID band Patient awake    Reviewed: Allergy & Precautions, NPO status , Patient's Chart, lab work & pertinent test results  Airway Mallampati: II  TM Distance: >3 FB Neck ROM: Full    Dental  (+) Teeth Intact, Dental Advisory Given, Missing,    Pulmonary former smoker   Pulmonary exam normal breath sounds clear to auscultation       Cardiovascular hypertension (139/89 preop), + Peripheral Vascular Disease (plavix)  Normal cardiovascular exam Rhythm:Regular Rate:Normal     Neuro/Psych negative neurological ROS  negative psych ROS   GI/Hepatic Neg liver ROS,GERD  Controlled,,  Endo/Other  diabetes, Poorly Controlled, Type 2, Insulin Dependent, Oral Hypoglycemic Agents  Obesity BMI 33 A1c 10.1  Renal/GU negative Renal ROS  negative genitourinary   Musculoskeletal negative musculoskeletal ROS (+)    Abdominal  (+) + obese  Peds  Hematology negative hematology ROS (+)   Anesthesia Other Findings recently underwent right lower extremity thromboembolectomy, right popliteal and tibioperoneal trunk endarterectomy, right popliteal and SFA stenting, and right lower extremity 4 compartment fasciotomies on 01/26/2024 by Dr. Randie Heinz.   Reproductive/Obstetrics negative OB ROS                             Anesthesia Physical Anesthesia Plan  ASA: 3  Anesthesia Plan: General   Post-op Pain Management: Tylenol PO (pre-op)*   Induction: Intravenous  PONV Risk Score and Plan: 2 and Ondansetron, Dexamethasone, Midazolam and Treatment may vary due to age or medical condition  Airway Management Planned: LMA  Additional Equipment: None  Intra-op Plan:   Post-operative Plan: Extubation in OR  Informed Consent: I have reviewed the patients History and Physical, chart, labs and discussed the procedure including the risks, benefits and  alternatives for the proposed anesthesia with the patient or authorized representative who has indicated his/her understanding and acceptance.     Dental advisory given  Plan Discussed with: CRNA  Anesthesia Plan Comments:        Anesthesia Quick Evaluation

## 2024-02-20 NOTE — Transfer of Care (Signed)
 Immediate Anesthesia Transfer of Care Note  Patient: Fred Green  Procedure(s) Performed: IRRIGATION AND DEBRIDEMENT WOUND RIGHT LEG (Right: Leg Lower) APPLICATION OF WOUND VAC (Right: Leg Lower) APPLICATION OF SKIN SUBSTITUTE USING KERECIS GRAFT 95 SQ CM (Right: Leg Lower)  Patient Location: PACU  Anesthesia Type:General  Level of Consciousness: awake, alert , and oriented  Airway & Oxygen Therapy: Patient Spontanous Breathing  Post-op Assessment: Report given to RN and Post -op Vital signs reviewed and stable  Post vital signs: Reviewed and stable  Last Vitals:  Vitals Value Taken Time  BP 140/95 02/20/24 1135  Temp    Pulse 95 02/20/24 1136  Resp 10 02/20/24 1136  SpO2 94 % 02/20/24 1136  Vitals shown include unfiled device data.  Last Pain:  Vitals:   02/20/24 0912  TempSrc:   PainSc: 5       Patients Stated Pain Goal: 4 (02/20/24 0906)  Complications: There were no known notable events for this encounter.

## 2024-02-21 ENCOUNTER — Other Ambulatory Visit (HOSPITAL_COMMUNITY): Payer: Self-pay

## 2024-02-21 ENCOUNTER — Encounter (HOSPITAL_COMMUNITY): Payer: Self-pay | Admitting: Vascular Surgery

## 2024-02-21 LAB — COMPREHENSIVE METABOLIC PANEL
ALT: 16 U/L (ref 0–44)
AST: 13 U/L — ABNORMAL LOW (ref 15–41)
Albumin: 2.7 g/dL — ABNORMAL LOW (ref 3.5–5.0)
Alkaline Phosphatase: 88 U/L (ref 38–126)
Anion gap: 8 (ref 5–15)
BUN: 20 mg/dL (ref 6–20)
CO2: 28 mmol/L (ref 22–32)
Calcium: 9.1 mg/dL (ref 8.9–10.3)
Chloride: 99 mmol/L (ref 98–111)
Creatinine, Ser: 1.25 mg/dL — ABNORMAL HIGH (ref 0.61–1.24)
GFR, Estimated: >60 mL/min (ref >60)
Glucose, Bld: 117 mg/dL — ABNORMAL HIGH (ref 70–99)
Potassium: 4.8 mmol/L (ref 3.5–5.1)
Sodium: 135 mmol/L (ref 135–145)
Total Bilirubin: 0.4 mg/dL (ref 0.0–1.2)
Total Protein: 6.3 g/dL — ABNORMAL LOW (ref 6.5–8.1)

## 2024-02-21 LAB — CBC
HCT: 31.1 % — ABNORMAL LOW (ref 39.0–52.0)
Hemoglobin: 10.1 g/dL — ABNORMAL LOW (ref 13.0–17.0)
MCH: 28.5 pg (ref 26.0–34.0)
MCHC: 32.5 g/dL (ref 30.0–36.0)
MCV: 87.6 fL (ref 80.0–100.0)
Platelets: 636 10*3/uL — ABNORMAL HIGH (ref 150–400)
RBC: 3.55 MIL/uL — ABNORMAL LOW (ref 4.22–5.81)
RDW: 13 % (ref 11.5–15.5)
WBC: 10.5 10*3/uL (ref 4.0–10.5)
nRBC: 0 % (ref 0.0–0.2)

## 2024-02-21 LAB — GLUCOSE, CAPILLARY
Glucose-Capillary: 103 mg/dL — ABNORMAL HIGH (ref 70–99)
Glucose-Capillary: 103 mg/dL — ABNORMAL HIGH (ref 70–99)
Glucose-Capillary: 87 mg/dL (ref 70–99)
Glucose-Capillary: 148 mg/dL — ABNORMAL HIGH (ref 70–99)

## 2024-02-21 NOTE — Progress Notes (Signed)
 Mobility Specialist Progress Note:    02/21/24 0850  Mobility  Activity Ambulated with assistance in hallway;Ambulated with assistance in room  Level of Assistance Contact guard assist, steadying assist  Assistive Device Front wheel walker  Distance Ambulated (ft) 240 ft  Activity Response Tolerated well  Mobility Referral Yes  Mobility visit 1 Mobility  Mobility Specialist Start Time (ACUTE ONLY) 0850  Mobility Specialist Stop Time (ACUTE ONLY) 0910  Mobility Specialist Time Calculation (min) (ACUTE ONLY) 20 min   Pt received in bed, agreeable to mobility session. Ambulated in hallway with RW and CGA for safety. Tolerated well, c/o RLE incision "burning." Max HR 143 bpm during session. Returned pt to room, lying comfortably in bed. Left pt with all needs met, call bell in reach.   Feliciana Rossetti Mobility Specialist Please contact via Special educational needs teacher or  Rehab office at [phone]

## 2024-02-21 NOTE — TOC Initial Note (Addendum)
 Transition of Care (TOC) - Initial/Assessment Note  Donn Pierini RN, BSN Transitions of Care Unit 4E- RN Case Manager See Treatment Team for direct phone #   Patient Details  Name: Fred Green MRN: [id number] Date of Birth: 08-03-1976  Transition of Care [facility]) CM/SW Contact:    Darrold Span, RN Phone Number: 02/21/2024, 4:06 PM  Clinical Narrative:                 Noted pt will need home wound VAC for discharge. Order in for [facility].  Order form for home [facility] has been signed and faxed to [employer] liaison to start insurance auth.   CM in to speak with pt at bedside- to offer [facility] choice- pt reports he is already active with [facility] for nursing and would like to continue with them.  Has DME- RW at home- declines any other DME needs at this time.  Pt states he has transportation home.   Updated pt that home VAC is pending insurance approval- pt will transition home once VAC approved and HH confirmed.   Call made to [facility]- confirmed pt is active for [facility]- and they can continue to service with home VAC- per [facility] would ideally like 3x week drsg changes but can do 2x if unable to staff for 3x. Frances Furbish states that they can commit to 2x week VAC drsg changes- and will plan to restart care first of week (Mon-3/3).   1610- received msg from [employer] that home VAC has been approved- POD to be emailed to this Clinical research associate for delivery to the bedside.   1645- home VAC delivered to bedside- signed POD faxed back to [employer] liaison- copy placed on chart and original left with pt and supplies.   Pt ready for transition home with [facility] and DME needs in place    Expected Discharge Plan: Home w Home Health Services Barriers to Discharge: Insurance Authorization   Patient Goals and CMS Choice Patient states their goals for this hospitalization and ongoing recovery are:: return home CMS Medicare.gov Compare Post Acute Care list provided to:: Patient Choice offered to / list presented to : Patient       Expected Discharge Plan and Services   Discharge Planning Services: CM Consult Post Acute Care Choice: Home Health, Resumption of Svcs/PTA Provider Living arrangements for the past 2 months: Single Family Home                 DME Arranged: Vac DME Agency: [facility] Date DME Agency Contacted: 02/21/24 Time DME Agency Contacted: 1430 Representative spoke with at DME Agency: French Ana HH Arranged: RN HH Agency: [facility] Date [facility] Agency Contacted: 02/21/24 Time HH Agency Contacted: 1600 Representative spoke with at [facility] Agency: Kandee Keen  Prior Living Arrangements/Services Living arrangements for the past 2 months: Single Family Home Lives with:: Spouse Patient language and need for interpreter reviewed:: Yes Do you feel safe going back to the place where you live?: Yes      Need for Family Participation in Patient Care: Yes (Comment) Care giver support system in place?: Yes (comment) Current home services: DME (RW) Criminal Activity/Legal Involvement Pertinent to Current Situation/Hospitalization: No - Comment as needed  Activities of Daily Living   ADL Screening (condition at time of admission) Independently performs ADLs?: Yes (appropriate for developmental age) Is the patient deaf or have difficulty hearing?: No Does the patient have difficulty seeing, even when wearing glasses/contacts?: No Does the patient have difficulty concentrating, remembering, or making  decisions?: No  Permission Sought/Granted Permission sought to share information with : Facility Industrial/product designer granted to share information with : Yes, Verbal Permission Granted     Permission granted to share info w AGENCY: HH/DME        Emotional Assessment Appearance:: Appears stated age Attitude/Demeanor/Rapport: Engaged Affect (typically observed): Accepting Orientation: : Oriented to Self, Oriented to Place, Oriented to  Time, Oriented to Situation Alcohol / Substance Use: Not  Applicable Psych Involvement: No (comment)  Admission diagnosis:  PAD (peripheral artery disease) (HCC) [I73.9] Patient Active Problem List   Diagnosis Date Noted   PAD (peripheral artery disease) (HCC) 02/20/2024   Vascular complication 01/26/2024   Critical limb ischemia of right lower extremity (HCC) 01/26/2024   Back pain 01/11/2016   Tobacco abuse 01/11/2016   Abnormal EKG 01/11/2016   GERD (gastroesophageal reflux disease)    Gastroesophageal reflux disease without esophagitis    PCP:  Pcp, No Pharmacy:   [id number] - [place], [place] - [address] AT [facility] [address]  [place] [address] Phone: [phone] Fax: [phone]  [facility] [address] [place] [address] Phone: [phone] Fax: [phone]     Social Drivers of Health (SDOH) Social History: SDOH Screenings   Food Insecurity: Food Insecurity Present (01/27/2024)  Utilities: Patient Unable To Answer (01/27/2024)  Tobacco Use: Medium Risk (02/20/2024)   SDOH Interventions:     Readmission Risk Interventions    01/31/2024   12:02 PM  Readmission Risk Prevention Plan  Post Dischage Appt Complete  Medication Screening Complete  Transportation Screening Complete

## 2024-02-21 NOTE — Progress Notes (Addendum)
  Progress Note    02/21/2024 8:00 AM 1 Day Post-Op  Subjective:  overall feeling good. Sharp shooting pains intermittently and burning that would wake him up over night   Vitals:   02/20/24 2348 02/21/24 0349  BP: 129/87 (!) 129/90  Pulse: 87 93  Resp: 13 13  Temp: 98.4 F (36.9 C) 98.2 F (36.8 C)  SpO2: 98% 95%   Physical Exam: Cardiac:  regular Lungs:  non labored Incisions:  Right lower extremity with VAC to suction Extremities:  palpable right DP pulse Neurologic: alert and oriented  CBC    Component Value Date/Time   WBC 10.5 02/21/2024 0322   RBC 3.55 (L) 02/21/2024 0322   HGB 10.1 (L) 02/21/2024 0322   HCT 31.1 (L) 02/21/2024 0322   PLT 636 (H) 02/21/2024 0322   MCV 87.6 02/21/2024 0322   MCH 28.5 02/21/2024 0322   MCHC 32.5 02/21/2024 0322   RDW 13.0 02/21/2024 0322   LYMPHSABS 2.6 01/26/2024 1225   MONOABS 0.9 01/26/2024 1225   EOSABS 0.1 01/26/2024 1225   BASOSABS 0.1 01/26/2024 1225    BMET    Component Value Date/Time   NA 135 02/21/2024 0322   K 4.8 02/21/2024 0322   CL 99 02/21/2024 0322   CO2 28 02/21/2024 0322   GLUCOSE 117 (H) 02/21/2024 0322   BUN 20 02/21/2024 0322   CREATININE 1.25 (H) 02/21/2024 0322   CALCIUM 9.1 02/21/2024 0322   GFRNONAA >60 02/21/2024 0322   GFRAA >60 01/12/2016 0615    INR    Component Value Date/Time   INR 1.2 02/20/2024 1738     Intake/Output Summary (Last 24 hours) at 02/21/2024 0800 Last data filed at 02/21/2024 0707 Gross per 24 hour  Intake 633.33 ml  Output 920 ml  Net -286.67 ml     Assessment/Plan:  48 y.o. male is s/p Excisional debridement of right lower extremity medial fasciotomy site and application of Kerecis fish skin with application of negative pressure dressing 1 Day Post-Op   Right leg with VAC to suction RLE well perfused with palpable DP Mobilize as tolerated Said morphine is not helping with pain. Oxycodone and Gabapentin most helpful so d/c morphine Home VAC and HH RN  orders placed   Graceann Congress, PA-C [facility] [phone] 02/21/2024 8:00 AM  I have independently interviewed and examined patient and agree with PA assessment and plan above.  Plan to change wound VAC Monday and DC when wound VAC has been arranged for home.  Brenlynn Fake C. Randie Heinz, MD [facility] [place] Office: [phone] Pager: [phone]

## 2024-02-21 NOTE — Plan of Care (Signed)

## 2024-02-22 LAB — COMPREHENSIVE METABOLIC PANEL
ALT: 14 U/L (ref 0–44)
AST: 15 U/L (ref 15–41)
Albumin: 2.8 g/dL — ABNORMAL LOW (ref 3.5–5.0)
Alkaline Phosphatase: 77 U/L (ref 38–126)
Anion gap: 10 (ref 5–15)
BUN: 15 mg/dL (ref 6–20)
CO2: 27 mmol/L (ref 22–32)
Calcium: 9.2 mg/dL (ref 8.9–10.3)
Chloride: 100 mmol/L (ref 98–111)
Creatinine, Ser: 1.11 mg/dL (ref 0.61–1.24)
GFR, Estimated: >60 mL/min (ref >60)
Glucose, Bld: 103 mg/dL — ABNORMAL HIGH (ref 70–99)
Potassium: 3.8 mmol/L (ref 3.5–5.1)
Sodium: 137 mmol/L (ref 135–145)
Total Bilirubin: 0.5 mg/dL (ref 0.0–1.2)
Total Protein: 6.5 g/dL (ref 6.5–8.1)

## 2024-02-22 LAB — GLUCOSE, CAPILLARY
Glucose-Capillary: 92 mg/dL (ref 70–99)
Glucose-Capillary: 103 mg/dL — ABNORMAL HIGH (ref 70–99)
Glucose-Capillary: 122 mg/dL — ABNORMAL HIGH (ref 70–99)
Glucose-Capillary: 83 mg/dL (ref 70–99)

## 2024-02-22 LAB — CBC
HCT: 29.9 % — ABNORMAL LOW (ref 39.0–52.0)
Hemoglobin: 9.8 g/dL — ABNORMAL LOW (ref 13.0–17.0)
MCH: 28.6 pg (ref 26.0–34.0)
MCHC: 32.8 g/dL (ref 30.0–36.0)
MCV: 87.2 fL (ref 80.0–100.0)
Platelets: 619 10*3/uL — ABNORMAL HIGH (ref 150–400)
RBC: 3.43 MIL/uL — ABNORMAL LOW (ref 4.22–5.81)
RDW: 13 % (ref 11.5–15.5)
WBC: 8.9 10*3/uL (ref 4.0–10.5)
nRBC: 0 % (ref 0.0–0.2)

## 2024-02-22 NOTE — Progress Notes (Addendum)
  Progress Note    02/22/2024 9:14 AM 2 Days Post-Op  Subjective: right leg feeling better. Still some sharp shooting pains in leg when he sits up on side of bed or after working with therapy   Vitals:   02/22/24 0410 02/22/24 0832  BP: (!) 144/82 138/82  Pulse: 94 91  Resp: 17 16  Temp: 98.3 F (36.8 C)   SpO2: 100% 98%   Physical Exam: Cardiac:  regular Lungs:  non labored Incisions: Right leg with VAC to suction Extremities: RLE is well perfused with palpable DP Neurologic: alert and oriented   CBC    Component Value Date/Time   WBC 8.9 02/22/2024 0307   RBC 3.43 (L) 02/22/2024 0307   HGB 9.8 (L) 02/22/2024 0307   HCT 29.9 (L) 02/22/2024 0307   PLT 619 (H) 02/22/2024 0307   MCV 87.2 02/22/2024 0307   MCH 28.6 02/22/2024 0307   MCHC 32.8 02/22/2024 0307   RDW 13.0 02/22/2024 0307   LYMPHSABS 2.6 01/26/2024 1225   MONOABS 0.9 01/26/2024 1225   EOSABS 0.1 01/26/2024 1225   BASOSABS 0.1 01/26/2024 1225    BMET    Component Value Date/Time   NA 137 02/22/2024 0307   K 3.8 02/22/2024 0307   CL 100 02/22/2024 0307   CO2 27 02/22/2024 0307   GLUCOSE 103 (H) 02/22/2024 0307   BUN 15 02/22/2024 0307   CREATININE 1.11 02/22/2024 0307   CALCIUM 9.2 02/22/2024 0307   GFRNONAA >60 02/22/2024 0307   GFRAA >60 01/12/2016 0615    INR    Component Value Date/Time   INR 1.2 02/20/2024 1738     Intake/Output Summary (Last 24 hours) at 02/22/2024 0914 Last data filed at 02/22/2024 0730 Gross per 24 hour  Intake 1400 ml  Output 1950 ml  Net -550 ml     Assessment/Plan:  48 y.o. male is s/p  Excisional debridement of right lower extremity medial fasciotomy site and application of Kerecis fish skin with application of negative pressure dressing 1 Day Post Op   Right leg with VAC to suction RLE remains well perfused and warm with palpable DP Mobilize as tolerated Multimodal pain control Plan is for VAC change at bedside Monday then [facility] RN will do next change later  in week [facility] RN will do 2x/week VAC changes Home VAC delivered to room   Graceann Congress, PA-C [facility] [phone] 02/22/2024 9:14 AM  I agree with the above.  Have seen and evaluated the patient.  His wound VAC remains in place with good seal.  Plan for wound VAC change Monday  Durene Cal

## 2024-02-22 NOTE — Progress Notes (Signed)
 Mobility Specialist Progress Note:    02/22/24 1212  Mobility  Activity Ambulated with assistance in hallway  Level of Assistance Standby assist, set-up cues, supervision of patient - no hands on  Assistive Device Front wheel walker  Distance Ambulated (ft) 470 ft  Activity Response Tolerated well  Mobility Referral Yes  Mobility visit 1 Mobility  Mobility Specialist Start Time (ACUTE ONLY) 1212  Mobility Specialist Stop Time (ACUTE ONLY) 1230  Mobility Specialist Time Calculation (min) (ACUTE ONLY) 18 min   Pt received in bed, eager for mobility session. Ambulated in hallway with RW and SBA for safety. Tolerated well, c/o dizziness, which resolved, during session. Took one standing rest break d/t high HR (144 bpm) . Returned pt ton room, sitting up in chair, all needs met, call bell in reach.   Feliciana Rossetti Mobility Specialist Please contact via Special educational needs teacher or  Rehab office at [phone]

## 2024-02-23 LAB — COMPREHENSIVE METABOLIC PANEL
ALT: 14 U/L (ref 0–44)
AST: 15 U/L (ref 15–41)
Albumin: 2.8 g/dL — ABNORMAL LOW (ref 3.5–5.0)
Alkaline Phosphatase: 73 U/L (ref 38–126)
Anion gap: 9 (ref 5–15)
BUN: 13 mg/dL (ref 6–20)
CO2: 25 mmol/L (ref 22–32)
Calcium: 9.1 mg/dL (ref 8.9–10.3)
Chloride: 102 mmol/L (ref 98–111)
Creatinine, Ser: 1 mg/dL (ref 0.61–1.24)
GFR, Estimated: >60 mL/min (ref >60)
Glucose, Bld: 97 mg/dL (ref 70–99)
Potassium: 3.9 mmol/L (ref 3.5–5.1)
Sodium: 136 mmol/L (ref 135–145)
Total Bilirubin: 0.4 mg/dL (ref 0.0–1.2)
Total Protein: 6.5 g/dL (ref 6.5–8.1)

## 2024-02-23 LAB — CBC
HCT: 30.1 % — ABNORMAL LOW (ref 39.0–52.0)
Hemoglobin: 9.8 g/dL — ABNORMAL LOW (ref 13.0–17.0)
MCH: 28.8 pg (ref 26.0–34.0)
MCHC: 32.6 g/dL (ref 30.0–36.0)
MCV: 88.5 fL (ref 80.0–100.0)
Platelets: 598 10*3/uL — ABNORMAL HIGH (ref 150–400)
RBC: 3.4 MIL/uL — ABNORMAL LOW (ref 4.22–5.81)
RDW: 12.9 % (ref 11.5–15.5)
WBC: 9.1 10*3/uL (ref 4.0–10.5)
nRBC: 0 % (ref 0.0–0.2)

## 2024-02-23 LAB — GLUCOSE, CAPILLARY
Glucose-Capillary: 109 mg/dL — ABNORMAL HIGH (ref 70–99)
Glucose-Capillary: 94 mg/dL (ref 70–99)
Glucose-Capillary: 103 mg/dL — ABNORMAL HIGH (ref 70–99)
Glucose-Capillary: 113 mg/dL — ABNORMAL HIGH (ref 70–99)

## 2024-02-23 NOTE — Plan of Care (Signed)

## 2024-02-23 NOTE — Progress Notes (Addendum)
  Progress Note    02/23/2024 7:36 AM 3 Days Post-Op  Subjective:  says he has been having increased swelling in right foot yesterday. He did sit in chair for a long time with foot in dependent position he explains. Has some improvement with elevation in bed. Has been having intense burning pain in leg and great toe   Vitals:   02/22/24 2334 02/23/24 0423  BP: (!) 156/85 (!) 135/90  Pulse: 97 92  Resp: 19 17  Temp: 98 F (36.7 C) 97.8 F (36.6 C)  SpO2: 100% 98%   Physical Exam: Cardiac:  regular Lungs:  non labored Incisions:  right leg with wound VAC to suction. SS output in canister Extremities:  RLE remains well perfused and warm with Doppler DP/PT/Pero signals. Right great toe with bloody drainage from plantar medial aspect/ tip of the toe, some dusky changes to the medial great toe and trophic nail changes  Neurologic: alert and oriented  CBC    Component Value Date/Time   WBC 9.1 02/23/2024 0311   RBC 3.40 (L) 02/23/2024 0311   HGB 9.8 (L) 02/23/2024 0311   HCT 30.1 (L) 02/23/2024 0311   PLT 598 (H) 02/23/2024 0311   MCV 88.5 02/23/2024 0311   MCH 28.8 02/23/2024 0311   MCHC 32.6 02/23/2024 0311   RDW 12.9 02/23/2024 0311   LYMPHSABS 2.6 01/26/2024 1225   MONOABS 0.9 01/26/2024 1225   EOSABS 0.1 01/26/2024 1225   BASOSABS 0.1 01/26/2024 1225    BMET    Component Value Date/Time   NA 136 02/23/2024 0311   K 3.9 02/23/2024 0311   CL 102 02/23/2024 0311   CO2 25 02/23/2024 0311   GLUCOSE 97 02/23/2024 0311   BUN 13 02/23/2024 0311   CREATININE 1.00 02/23/2024 0311   CALCIUM 9.1 02/23/2024 0311   GFRNONAA >60 02/23/2024 0311   GFRAA >60 01/12/2016 0615    INR    Component Value Date/Time   INR 1.2 02/20/2024 1738     Intake/Output Summary (Last 24 hours) at 02/23/2024 0736 Last data filed at 02/23/2024 0620 Gross per 24 hour  Intake 840 ml  Output 2335 ml  Net -1495 ml     Assessment/Plan:  48 y.o. male is s/p Excisional debridement of right  lower extremity medial fasciotomy site and application of Kerecis fish skin with application of negative pressure dressing  3 Days Post-Op   RLE remains well perfused and warm with doppler DP/ PT/ Pero signals Right medial fasciotomy site with VAC to suction Right great toe bleeding. Concern for small abscess on medial aspect of toe. Ordered toe to be painted with betadine Pain control PRN H&H stable Okay to mobilize as tolerated Elevate leg when chair or bed Plan is for VAC change at bedside tomorrow morning then d/c home  [facility] RN and VAC already arranged  Graceann Congress, PA-C [facility] [phone] 02/23/2024 7:36 AM  I agree with the above.  Have seen and evaluated the patient.  He is complaining of pain in his right great toe.  There has been some drainage.  He has extensive onychomycosis of his toes.  He says he has appointment with podiatry in the near future.  Plan for wound VAC change tomorrow and hopeful discharge.  Durene Cal

## 2024-02-24 ENCOUNTER — Other Ambulatory Visit (HOSPITAL_COMMUNITY): Payer: Self-pay

## 2024-02-24 LAB — GLUCOSE, CAPILLARY: Glucose-Capillary: 100 mg/dL — ABNORMAL HIGH (ref 70–99)

## 2024-02-24 MED ORDER — OXYCODONE-ACETAMINOPHEN 5-325 MG PO TABS
1.0000 | ORAL_TABLET | Freq: Four times a day (QID) | ORAL | 0 refills | Status: DC | PRN
  Filled 2024-02-24: qty 20, 5d supply, fill #0

## 2024-02-24 MED ORDER — OXYCODONE-ACETAMINOPHEN 5-325 MG PO TABS
1.0000 | ORAL_TABLET | Freq: Four times a day (QID) | ORAL | 0 refills | Status: DC | PRN
Start: 2024-02-24 — End: 2024-02-24

## 2024-02-24 MED ORDER — MORPHINE SULFATE (PF) 2 MG/ML IV SOLN
1.0000 mg | Freq: Once | INTRAVENOUS | Status: AC
  Administered 2024-02-24: 1 mg via INTRAVENOUS
  Filled 2024-02-24: qty 1

## 2024-02-24 NOTE — Progress Notes (Signed)
 Dressing applied to right leg with ABD pad and kerlix and wrapped over wound vac with Ace wrap.  Wound vac machine was changed out to home portable machine at this time.  SWOT RN in to do discharge teaching at this time.  Awaiting on ride to bring up clothing for discharge.  All questions answered about wound vac at this time.

## 2024-02-24 NOTE — Plan of Care (Signed)

## 2024-02-24 NOTE — Discharge Summary (Signed)
 Discharge Summary  Patient ID: JAN OLANO [id number] 48 y.o. 03/03/1976  Admit date: 02/20/2024  Discharge date and time: 02/24/2024 10:44 AM   Admitting Physician: Maeola Harman, MD   Discharge Physician: Lemar Livings, MD  Admission Diagnoses: PAD (peripheral artery disease) [facility]) [I73.9]  Discharge Diagnoses: PAD (peripheral artery disease) (HCC) [I73.9]  Admission Condition: stable  Discharged Condition: fair  Indication for Admission: 48 year old male status post right lower extremity revascularization of acute on chronic disease. He is now maintained on aspirin and Plavix. He has nonhealing medial fasciotomy site of the right lower extremity is indicated for debridement  Hospital Course: Mr. Harbeson was admitted on 2/27 and underwent sharp excisional debridement of skin and subcutaneous tissue of the right lower extremity medical fasciotomy site with application of Kerecis fish skin and negative pressure dressing. He tolerated this well and was taken to recovery room in stable condition.   The remainder of his hospital stay consisted of pain control, improved mobilization, arrangement of home VAC and home health RN.   By POD#4, he remained stable for discharge home. VAC changed prior to discharge with healthy appearing tissue in medial fasciotomy site. Lateral leg incision will need to continue local wound care to and keep dry. Some necrosis of the skin edges. Pain improved. Patient has home VAC arranged and HH RN arranged with [facility]. They will perform next VAC change on Thursday or Friday. Patient will resume home medications as prescribed. PDMP reviewed and post operative pain medication sent to the [facility] pharmacy. He will follow up in our office in 2-3 weeks for wound check.   Consults: None  Treatments: antibiotics: Zosyn, analgesia: acetaminophen, Oxycodone, Morphine , therapies: RN and SW, and surgery: 1.  Sharp excisional debridement of skin and subcutaneous  tissue right lower extremity medial fasciotomy site to a total of 24 x 7 cm with pulse lavage 1 L normal saline and 1 L of Vashe fluid 2.  Application 95 cm Kerecis fish skin 3.  Application negative pressure dressing     Disposition: Discharge disposition: 01-Home or Self Care       Patient Instructions:  Allergies as of 02/24/2024   No Known Allergies      Medication List     TAKE these medications    aspirin EC 81 MG tablet Take 1 tablet (81 mg total) by mouth daily. Swallow whole.   clopidogrel 75 MG tablet Commonly known as: PLAVIX Take 1 tablet (75 mg total) by mouth daily at 6 (six) AM.   gabapentin 300 MG capsule Commonly known as: NEURONTIN Take 1 capsule (300 mg total) by mouth 3 (three) times daily.   HumaLOG Mix 75/25 KwikPen (75-25) 100 UNIT/ML KwikPen Generic drug: Insulin Lispro Prot & Lispro Inject 10 Units into the skin 2 (two) times daily with a meal.   ibuprofen 200 MG tablet Commonly known as: ADVIL Take 400 mg by mouth every 6 (six) hours as needed for mild pain (pain score 1-3) or moderate pain (pain score 4-6).   Insupen Pen Needles 31G X 8 MM Misc Generic drug: Insulin Pen Needle Use as directed 3 (three) times daily.   metFORMIN 500 MG tablet Commonly known as: GLUCOPHAGE Take 1 tablet (500 mg total) by mouth 2 (two) times daily with a meal.   oxyCODONE-acetaminophen 5-325 MG tablet Commonly known as: PERCOCET/ROXICET Take 1 tablet by mouth every 6 (six) hours as needed (severe pain (pain score 8-10)). What changed: how much to take   rosuvastatin 20 MG  tablet Commonly known as: CRESTOR Take 1 tablet (20 mg total) by mouth daily.               Durable Medical Equipment  (From admission, onward)           Start     Ordered   02/21/24 0707  For home use only DME Vac  Once        02/21/24 0708              Discharge Care Instructions  (From admission, onward)           Start     Ordered   02/24/24 0000   Discharge wound care:       Comments: May wash right lateral incision with mild soap and water, pat dry. Do not soak in bathtub or shower. Wound VAC will be changed 2x/week by Home Health RN. If would directions change you or your Home Health RN will be instructed accordingly   02/24/24 0837           Activity: activity as tolerated, no driving while on analgesics, and no heavy lifting for 6 weeks Diet: cardiac diet, diabetic diet, and low fat, low cholesterol diet Wound Care:  Wash right lateral incision with mild soap and water pat dry. Can leave open to air or apply dry gauze dressing daily. Right medical vac wound will be changed by Home Health RN 2x/week  Follow-up with [facility] in  2-3  weeks.  SignedGraceann Congress, PA-C 02/24/2024 10:51 AM [facility] Office: [phone]

## 2024-02-24 NOTE — Progress Notes (Signed)
 DISCHARGE NOTE HOME Fred Green to be discharged Home per MD order. Discussed prescriptions and follow up appointments with the patient. Prescriptions given to patient; medication list explained in detail. Patient verbalized understanding.  Skin clean, dry and intact without evidence of skin break down, no evidence of skin tears noted. IV catheter discontinued intact. Site without signs and symptoms of complications. Dressing and pressure applied. Pt denies pain at the site currently. No complaints noted.  Patient discharging with wound as noted on LDA and Wound vac  Patient free of other lines, drains, and wounds   An After Visit Summary (AVS) was printed and given to the patient. Patient escorted via wheelchair, and discharged home via private auto.  [name], RN

## 2024-02-24 NOTE — TOC Transition Note (Signed)
 Transition of Care [facility]) - Discharge Note Donn Pierini RN, BSN Transitions of Care Unit 4E- RN Case Manager See Treatment Team for direct phone #   Patient Details  Name: Fred Green MRN: [id number] Date of Birth: 03-05-1976  Transition of Care [facility]) CM/SW Contact:  Darrold Span, RN Phone Number: 02/24/2024, 12:07 PM   Clinical Narrative:    Pt stable for transition home today, wound VAC drsg has been changed and plan for [facility] to change later in the week.   Home VAC at the bedside (Delivered 2/28) Bedside RN will change over to home [facility] prior to discharge and supplies to be sent home   CM notified [facility] liaison for start of care- end of week for next wound VAC drsg change- 2x week .   Family to transport home- no further TOC needs noted.    Final next level of care: Home w Home Health Services Barriers to Discharge: Barriers Resolved   Patient Goals and CMS Choice Patient states their goals for this hospitalization and ongoing recovery are:: return home CMS Medicare.gov Compare Post Acute Care list provided to:: Patient Choice offered to / list presented to : Patient      Discharge Placement               Home w/ [facility]        Discharge Plan and Services Additional resources added to the After Visit Summary for     Discharge Planning Services: CM Consult Post Acute Care Choice: Home Health, Resumption of Svcs/PTA Provider          DME Arranged: Vac DME Agency: [facility] Date DME Agency Contacted: 02/21/24 Time DME Agency Contacted: 1430 Representative spoke with at DME Agency: [place] HH Arranged: RN HH Agency: [facility] Date [facility] Agency Contacted: 02/21/24 Time HH Agency Contacted: 1600 Representative spoke with at [facility] Agency: Kandee Keen  Social Drivers of Health (SDOH) Interventions SDOH Screenings   Food Insecurity: Food Insecurity Present (01/27/2024)  Utilities: Patient Unable To Answer (01/27/2024)  Tobacco Use: Medium Risk (02/20/2024)      Readmission Risk Interventions    01/31/2024   12:02 PM  Readmission Risk Prevention Plan  Post Dischage Appt Complete  Medication Screening Complete  Transportation Screening Complete

## 2024-02-24 NOTE — Anesthesia Postprocedure Evaluation (Signed)
 Anesthesia Post Note  Patient: Fred Green  Procedure(s) Performed: IRRIGATION AND DEBRIDEMENT WOUND RIGHT LEG (Right: Leg Lower) APPLICATION OF WOUND VAC (Right: Leg Lower) APPLICATION OF SKIN SUBSTITUTE USING KERECIS GRAFT 95 SQ CM (Right: Leg Lower)     Patient location during evaluation: PACU Anesthesia Type: General Level of consciousness: awake and alert Pain management: pain level controlled Vital Signs Assessment: post-procedure vital signs reviewed and stable Respiratory status: spontaneous breathing, nonlabored ventilation, respiratory function stable and patient connected to nasal cannula oxygen Cardiovascular status: blood pressure returned to baseline and stable Postop Assessment: no apparent nausea or vomiting Anesthetic complications: no   There were no known notable events for this encounter.  Last Vitals:  Vitals:   02/24/24 0542 02/24/24 0831  BP: (!) 150/89 132/83  Pulse: 89 83  Resp: 18 13  Temp: 36.6 C 36.8 C  SpO2: 99% 96%    Last Pain:  Vitals:   02/24/24 0831  TempSrc: Oral  PainSc:                  [name] S

## 2024-02-24 NOTE — Progress Notes (Addendum)
  Progress Note    02/24/2024 8:29 AM 4 Days Post-Op  Subjective:  pain better controlled. Ready to go home   Vitals:   02/24/24 0001 02/24/24 0542  BP: (!) 150/84 (!) 150/89  Pulse: 77 89  Resp: 18 18  Temp: 98.1 F (36.7 C) 97.9 F (36.6 C)  SpO2: 98% 99%   Physical Exam: Cardiac:  regular Lungs:  non labored Incisions:  right lateral leg incision with necrotic skin edges and maceration present. Some epidermolysis along anterior shin. Staples in place along incision. Right medial wound healthy appearing, clean healthy granulation tissue in wound bed     Extremities: RLE well perfused and warm  Neurologic: alert and oriented   CBC    Component Value Date/Time   WBC 9.1 02/23/2024 0311   RBC 3.40 (L) 02/23/2024 0311   HGB 9.8 (L) 02/23/2024 0311   HCT 30.1 (L) 02/23/2024 0311   PLT 598 (H) 02/23/2024 0311   MCV 88.5 02/23/2024 0311   MCH 28.8 02/23/2024 0311   MCHC 32.6 02/23/2024 0311   RDW 12.9 02/23/2024 0311   LYMPHSABS 2.6 01/26/2024 1225   MONOABS 0.9 01/26/2024 1225   EOSABS 0.1 01/26/2024 1225   BASOSABS 0.1 01/26/2024 1225    BMET    Component Value Date/Time   NA 136 02/23/2024 0311   K 3.9 02/23/2024 0311   CL 102 02/23/2024 0311   CO2 25 02/23/2024 0311   GLUCOSE 97 02/23/2024 0311   BUN 13 02/23/2024 0311   CREATININE 1.00 02/23/2024 0311   CALCIUM 9.1 02/23/2024 0311   GFRNONAA >60 02/23/2024 0311   GFRAA >60 01/12/2016 0615    INR    Component Value Date/Time   INR 1.2 02/20/2024 1738     Intake/Output Summary (Last 24 hours) at 02/24/2024 0829 Last data filed at 02/24/2024 0600 Gross per 24 hour  Intake 480 ml  Output 1345 ml  Net -865 ml     Assessment/Plan:  48 y.o. male is s/p  Excisional debridement of right lower extremity medial fasciotomy site and application of Kerecis fish skin with application of negative pressure dressing  4 Days Post-Op   RLE well perfused and warm. VAC changed with healthy granulation tissue in  wound bed Has Home VAC and HH RN arranged for 2x/week VAC changes Right lateral incision staples intact. Will keep in for a couple more weeks. Some necrotic skin changes along incision and superficial skin wound on shin Pain control with Oxycodone and Gabapentin He is stable for discharge home today He will follow up in our office in 2-3 weeks    Graceann Congress, [place] [facility] [phone] 02/24/2024 8:29 AM   I have interviewed and examined patient with PA and agree with assessment and plan above. Lateral leg concerning but will hopefully dry out. Medial fasciotomy site healing well. Ok for home with vac and will f/u short interval wound check.  Chandlor Noecker C. Randie Heinz, MD [facility] [place] Office: [phone] Pager: [phone]

## 2024-02-24 NOTE — Plan of Care (Signed)

## 2024-02-27 ENCOUNTER — Telehealth: Payer: Self-pay

## 2024-02-27 NOTE — Telephone Encounter (Addendum)
 Advice/Triage/HH: Ludger Nutting, RN @ [facility] called while at pt's residence.  RN's phone call was difficult to hear d/t possible reception issues and understand that she is there changing his wound vac, that the canister has red blood.  She states the patient has changed the canister also.  Total of 2 full canisters and 3rd one being applied now.   -Devon also reports high BP of systolic in the 170's when they got there but they re-took it and was in the 150's. -RN confirms understanding that if this new canister fills up in 24 hours he is to call the office immediately and placed this nurse on speaker phone so pt could hear and she confirms pt understands.   -pt assured he may need to come to the office tomorrow for evaluation.  -RN took my name for reference.    -returned call to pt at 1422 who states there has only been 1 canister that was full on the wound vac that 1 canister was clogged so he had to change it.  He also affirms he is wrapping his leg with an ACE bandage as well.  It bleeds the most after he gets up to go to the bathroom or something and then he lays back down but it has not bleed that much he said.  He confirms understanding that he will call us in the am if he is bleeding too much or something is wrong with the wound vac including a full canister. Ludger Nutting, RN returned call. Informed her of latest Hgb values, confirmed pt is taking plavix & aspirin, and this nurse has talked to pt about following up with any problems

## 2024-03-04 ENCOUNTER — Other Ambulatory Visit (HOSPITAL_COMMUNITY): Payer: Self-pay

## 2024-03-05 ENCOUNTER — Other Ambulatory Visit (HOSPITAL_COMMUNITY): Payer: Self-pay

## 2024-03-09 ENCOUNTER — Other Ambulatory Visit: Payer: Self-pay | Admitting: Physician Assistant

## 2024-03-09 MED ORDER — GABAPENTIN 300 MG PO CAPS
300.0000 mg | ORAL_CAPSULE | Freq: Three times a day (TID) | ORAL | 1 refills | Status: DC | PRN
Start: 2024-03-09 — End: 2024-07-29

## 2024-03-10 ENCOUNTER — Telehealth: Payer: Self-pay

## 2024-03-10 ENCOUNTER — Ambulatory Visit: Admitting: Podiatry

## 2024-03-10 NOTE — Telephone Encounter (Signed)
 Triage:  -received message from pt stating the [facility] RN put Vaseline guaze on his incision on the LE that has staples and he took it off noticing white skin all the way down the incision where the staples are.   -returned call, spoke to wife and advised them to keep the incision open to air.  If they were still concerned about the area we can possible move the appt up.   -wife also stated he had a routine podiatry appt this week and decided to push that back until he gets his leg healed.

## 2024-03-18 ENCOUNTER — Ambulatory Visit (INDEPENDENT_AMBULATORY_CARE_PROVIDER_SITE_OTHER): Admitting: Physician Assistant

## 2024-03-18 ENCOUNTER — Encounter: Payer: Self-pay | Admitting: Physician Assistant

## 2024-03-18 VITALS — BP 144/95 | HR 96 | Temp 98.2°F | Ht 76.0 in | Wt 276.4 lb

## 2024-03-18 DIAGNOSIS — Z9889 Other specified postprocedural states: Secondary | ICD-10-CM

## 2024-03-18 DIAGNOSIS — T8189XD Other complications of procedures, not elsewhere classified, subsequent encounter: Secondary | ICD-10-CM

## 2024-03-18 DIAGNOSIS — I70221 Atherosclerosis of native arteries of extremities with rest pain, right leg: Secondary | ICD-10-CM

## 2024-03-18 NOTE — Progress Notes (Unsigned)
  POST OPERATIVE OFFICE NOTE    CC:  F/u for surgery  HPI:  This is a 48 y.o. male who is s/p irrigation and debridement of right leg on 02/20/24 by Dr. Randie Green.    Pt returns today for follow up wound check.  Pt states ***   No Known Allergies  Current Outpatient Medications  Medication Sig Dispense Refill   aspirin EC 81 MG tablet Take 1 tablet (81 mg total) by mouth daily. Swallow whole. 30 tablet 12   clopidogrel (PLAVIX) 75 MG tablet Take 1 tablet (75 mg total) by mouth daily at 6 (six) AM. 30 tablet 11   gabapentin (NEURONTIN) 300 MG capsule Take 1 capsule (300 mg total) by mouth 3 (three) times daily as needed. 90 capsule 1   ibuprofen (ADVIL,MOTRIN) 200 MG tablet Take 400 mg by mouth every 6 (six) hours as needed for mild pain (pain score 1-3) or moderate pain (pain score 4-6). (Patient not taking: Reported on 02/19/2024)     Insulin Lispro Prot & Lispro (HUMALOG 75/25 MIX) (75-25) 100 UNIT/ML Kwikpen Inject 10 Units into the skin 2 (two) times daily with a meal. 15 mL 0   Insulin Pen Needle 31G X 8 MM MISC Use as directed 3 (three) times daily. 100 each 0   metFORMIN (GLUCOPHAGE) 500 MG tablet Take 1 tablet (500 mg total) by mouth 2 (two) times daily with a meal. 60 tablet 3   oxyCODONE-acetaminophen (PERCOCET/ROXICET) 5-325 MG tablet Take 1 tablet by mouth every 6 (six) hours as needed (severe pain (pain score 8-10)). 20 tablet 0   rosuvastatin (CRESTOR) 20 MG tablet Take 1 tablet (20 mg total) by mouth daily. 30 tablet 11   No current facility-administered medications for this visit.     ROS:  See HPI  Physical Exam:  Vitals:   03/18/24 1425  BP: (!) 144/95  Pulse: 96  Temp: 98.2 F (36.8 C)  SpO2: 98%     Incision:   Right medial fasciotomy site with healthy granulation tissue. Wound VAC reapplied  Wound on right great toe  Eschar present on lateral aspect of right leg. Staples present. Staples removed Extremities:  RLE well perfused and warm with doppler PT/DP  signals Neuro: alert and oriented   Assessment/Plan:  This is a 48 y.o. male who is s/p: irrigation and debridement of right leg on 02/20/24 by Dr. Randie Green.   *** - staples removed on lateral and proximal medial wounds - Dry gauze to right lateral wound to keep dry - Continue twice weekly wound VAC changes to medial fasciotomy site -***   Fred Green, [facility] [phone]   Clinic MD:  Fred Green

## 2024-03-19 ENCOUNTER — Encounter: Payer: Self-pay | Admitting: Physician Assistant

## 2024-03-31 ENCOUNTER — Ambulatory Visit (INDEPENDENT_AMBULATORY_CARE_PROVIDER_SITE_OTHER): Admitting: Podiatry

## 2024-03-31 DIAGNOSIS — D689 Coagulation defect, unspecified: Secondary | ICD-10-CM | POA: Diagnosis not present

## 2024-03-31 DIAGNOSIS — B351 Tinea unguium: Secondary | ICD-10-CM

## 2024-03-31 DIAGNOSIS — E1142 Type 2 diabetes mellitus with diabetic polyneuropathy: Secondary | ICD-10-CM | POA: Diagnosis not present

## 2024-03-31 DIAGNOSIS — M79676 Pain in unspecified toe(s): Secondary | ICD-10-CM | POA: Diagnosis not present

## 2024-03-31 DIAGNOSIS — D2371 Other benign neoplasm of skin of right lower limb, including hip: Secondary | ICD-10-CM

## 2024-03-31 DIAGNOSIS — D2372 Other benign neoplasm of skin of left lower limb, including hip: Secondary | ICD-10-CM | POA: Diagnosis not present

## 2024-03-31 NOTE — Progress Notes (Signed)
 Subjective:  Patient ID: Fred Green, male    DOB: 1976-01-27,  MRN: [id number] HPI Chief Complaint  Patient presents with   [facility]    Last A1c: 6.4.  takes Plavix and asa 81 mg. Needs nails trimmed and dead skin.    Wound Check    Right 1st hallux has a wound that he would like you to take a look at. Present for about a month. Vascular has looked at the wound, currently doing wet to dry dressings which are changed by his wife. PCP checked wound 03/25/24 and will be seeing them again 04/08/24.    Foot Pain    Right foot pain. Questionable whether it is gout. He says that he received injections a couple of months ago but now the provider who did the injections believes that there is a "fluid pocket". I did explain that we may need to schedule another appointment to discuss additional concerns.     48 y.o. male presents with the above complaint.   ROS: Denies fever chills nausea vomiting muscle aches and pains.  Past Medical History:  Diagnosis Date   Diabetes mellitus without complication (HCC)    GERD (gastroesophageal reflux disease)    Gout    Peripheral arterial disease (HCC)    Peripheral vascular disease (HCC)    Tobacco abuse    Past Surgical History:  Procedure Laterality Date   APPLICATION OF WOUND VAC Right 02/20/2024   Procedure: APPLICATION OF WOUND VAC;  Surgeon: Maeola Harman, MD;  Location: [facility] OR;  Service: Vascular;  Laterality: Right;   FASCIOTOMY Right 01/26/2024   Procedure: RIGHT LOWER LEG FOUR COMPARTMENT FASCIOTOMY;  Surgeon: Maeola Harman, MD;  Location: [facility] OR;  Service: Vascular;  Laterality: Right;   INCISION AND DRAINAGE OF WOUND Right 02/20/2024   Procedure: IRRIGATION AND DEBRIDEMENT WOUND RIGHT LEG;  Surgeon: Maeola Harman, MD;  Location: [facility] OR;  Service: Vascular;  Laterality: Right;   INSERTION OF ILIAC STENT Right 01/26/2024   Procedure: INSERTION OF RIGHT POPITEAL ARTERY STENT;  Surgeon: Maeola Harman, MD;   Location: [facility] OR;  Service: Vascular;  Laterality: Right;   LOWER EXTREMITY ANGIOGRAM Right 01/26/2024   Procedure: RIGHT LOWER EXTREMITY ANGIOGRAM;  Surgeon: Maeola Harman, MD;  Location: [facility] OR;  Service: Vascular;  Laterality: Right;   NO PAST SURGERIES     PATCH ANGIOPLASTY Right 01/26/2024   Procedure: PATCH ANGIOPLASTY USING Kathleen Lime;  Surgeon: Maeola Harman, MD;  Location: [facility] OR;  Service: Vascular;  Laterality: Right;   THROMBECTOMY OF BYPASS GRAFT FEMORAL- POPLITEAL ARTERY Right 01/26/2024   Procedure: RIGHT LOWER EXTREMITY THROMBECTOMY;  Surgeon: Maeola Harman, MD;  Location: [facility] OR;  Service: Vascular;  Laterality: Right;    Current Outpatient Medications:    aspirin EC 81 MG tablet, Take 1 tablet (81 mg total) by mouth daily. Swallow whole., Disp: 30 tablet, Rfl: 12   clopidogrel (PLAVIX) 75 MG tablet, Take 1 tablet (75 mg total) by mouth daily at 6 (six) AM., Disp: 30 tablet, Rfl: 11   gabapentin (NEURONTIN) 300 MG capsule, Take 1 capsule (300 mg total) by mouth 3 (three) times daily as needed., Disp: 90 capsule, Rfl: 1   ibuprofen (ADVIL,MOTRIN) 200 MG tablet, Take 400 mg by mouth every 6 (six) hours as needed for mild pain (pain score 1-3) or moderate pain (pain score 4-6)., Disp: , Rfl:    Insulin Lispro Prot & Lispro (HUMALOG 75/25 MIX) (75-25) 100 UNIT/ML Kwikpen, Inject 10 Units  into the skin 2 (two) times daily with a meal., Disp: 15 mL, Rfl: 0   Insulin Pen Needle 31G X 8 MM MISC, Use as directed 3 (three) times daily., Disp: 100 each, Rfl: 0   metFORMIN (GLUCOPHAGE) 500 MG tablet, Take 1 tablet (500 mg total) by mouth 2 (two) times daily with a meal., Disp: 60 tablet, Rfl: 3   oxyCODONE-acetaminophen (PERCOCET/ROXICET) 5-325 MG tablet, Take 1 tablet by mouth every 6 (six) hours as needed (severe pain (pain score 8-10))., Disp: 20 tablet, Rfl: 0   rosuvastatin (CRESTOR) 20 MG tablet, Take 1 tablet (20 mg total) by mouth daily., Disp: 30 tablet,  Rfl: 11  No Known Allergies Review of Systems Objective:  There were no vitals filed for this visit.  General: Well developed, nourished, in no acute distress, alert and oriented x3   Dermatological: Skin is warm, dry and supple bilateral. Nails x x 10 are thick yellow dystrophic clinically mycotic painful on palpation.  Remaining integument appears unremarkable at this time. There are no open sores, no preulcerative lesions, no rash or signs of infection present.  He has an ulceration beneath the tip of the hallux right this developed prior to his revascularization.  Is improving with wet-to-dry dressings.  He also has severe xerosis bilateral foot.  Benign reactive hyperkeratotic lesions plantar aspect of the fifth metatarsal heads bilateral.  Vascular: Dorsalis Pedis artery and Posterior Tibial artery pedal pulses are 2/4 bilateral with immedate capillary fill time. Pedal hair growth present. No varicosities and no lower extremity edema present bilateral.   Neruologic: Grossly intact via light touch bilateral. Vibratory intact via tuning fork bilateral. Protective threshold with Semmes Wienstein monofilament intact to all pedal sites bilateral. Patellar and Achilles deep tendon reflexes 2+ bilateral. No Babinski or clonus noted bilateral.   Musculoskeletal: No gross boney pedal deformities bilateral. No pain, crepitus, or limitation noted with foot and ankle range of motion bilateral. Muscular strength 5/5 in all groups tested bilateral.  Gait: Unassisted, Nonantalgic.    Radiographs:  None taken  Assessment & Plan:   Assessment: Severe peripheral vascular disease with diabetic angiopathy.  Just underwent bypass.  Revascularization allowing for wound to heal distal hallux right.  Pain in limb secondary to onychomycosis.  Plan: Debrided benign skin lesion subfifth bilateral.  Debrided ulcer to bleeding hallux right.  And debrided toenails 1 through 5 bilateral.  Dressed the ulcerative  lesion today with Xeroform and light gauze dressing.  Nursing will continue to dress his wound daily we will follow-up with him on an as-needed basis for routine nail debridement.     Denorris Reust T. [place], [place]

## 2024-04-02 ENCOUNTER — Ambulatory Visit: Payer: Medicaid Other | Admitting: Pharmacist Clinician (PhC)/ Clinical Pharmacy Specialist

## 2024-04-15 ENCOUNTER — Ambulatory Visit (INDEPENDENT_AMBULATORY_CARE_PROVIDER_SITE_OTHER): Admitting: Physician Assistant

## 2024-04-15 VITALS — BP 143/95 | HR 99 | Temp 98.3°F | Ht 74.0 in | Wt 267.2 lb

## 2024-04-15 DIAGNOSIS — T8189XD Other complications of procedures, not elsewhere classified, subsequent encounter: Secondary | ICD-10-CM

## 2024-04-15 DIAGNOSIS — I70221 Atherosclerosis of native arteries of extremities with rest pain, right leg: Secondary | ICD-10-CM

## 2024-04-15 NOTE — Progress Notes (Signed)
 POST OPERATIVE OFFICE NOTE    CC:  F/u for surgery  HPI:  Fred Green is a 48 y.o. male who is here for wound check.  He recently underwent irrigation and debridement of the right leg on 02/20/2024 by Dr. Vikki Graves.  At his last visit with our office his medial leg wound was healing well with 2 times weekly wound VAC changes.  He had a dry eschar on the lateral aspect of his right leg.  His right great toe ulceration was also healing.  He returns today for follow-up.  He says that he is feeling great.  He barely has any pain in the right leg anymore.  He is able to walk a lot throughout the day without any issues.  His wounds on his right lateral leg and great toe are nearly healed.  His right medial fasciotomy site continues to heal, and he was transitioned to daily wet-to-dry dressing changes about 3 weeks ago.   No Known Allergies  Current Outpatient Medications  Medication Sig Dispense Refill   aspirin  EC 81 MG tablet Take 1 tablet (81 mg total) by mouth daily. Swallow whole. 30 tablet 12   clopidogrel  (PLAVIX ) 75 MG tablet Take 1 tablet (75 mg total) by mouth daily at 6 (six) AM. 30 tablet 11   gabapentin  (NEURONTIN ) 300 MG capsule Take 1 capsule (300 mg total) by mouth 3 (three) times daily as needed. 90 capsule 1   ibuprofen  (ADVIL ,MOTRIN ) 200 MG tablet Take 400 mg by mouth every 6 (six) hours as needed for mild pain (pain score 1-3) or moderate pain (pain score 4-6).     Insulin  Lispro Prot & Lispro (HUMALOG  75/25 MIX) (75-25) 100 UNIT/ML Kwikpen Inject 10 Units into the skin 2 (two) times daily with a meal. 15 mL 0   Insulin  Pen Needle 31G X 8 MM MISC Use as directed 3 (three) times daily. 100 each 0   metFORMIN  (GLUCOPHAGE ) 500 MG tablet Take 1 tablet (500 mg total) by mouth 2 (two) times daily with a meal. 60 tablet 3   rosuvastatin  (CRESTOR ) 20 MG tablet Take 1 tablet (20 mg total) by mouth daily. 30 tablet 11   No current facility-administered medications for this visit.      ROS:  See HPI  Physical Exam:   Incision: Right lateral fasciotomy site wound nearly healed with dry ulceration.  Right medial fasciotomy site is much smaller and has healthy granulation tissue present Extremities:  2+ right PT pulse Neuro: Intact motor and sensation of the right foot,           Assessment/Plan:  This is a 48 y.o. male who is here for wound check  - The patient has a history of right lower extremity thrombectomy and 4 compartment fasciotomies in February for acute limb ischemia.  He subsequently required debridement of his right medial fasciotomy site - His right lateral fasciotomy site is nearly healed with a much smaller dry eschar - His right medial fasciotomy site is much smaller and appears healthy. - His right leg is warm and well-perfused with a 2+ PT pulse. He has intact motor and sensation of the right leg and foot - He will continue wound care to the right lower leg, including daily wet-to-dry dressing changes to his right medial fasciotomy site.  - He can return to clinic in 1 month for repeat wound check.  Once his wounds are healed, he will need to be brought back for arterial studies   Deneise Finlay,  PA-C [facility] [phone]   Clinic MD:  Vikki Graves

## 2024-04-20 ENCOUNTER — Institutional Professional Consult (permissible substitution) (HOSPITAL_BASED_OUTPATIENT_CLINIC_OR_DEPARTMENT_OTHER): Admitting: Internal Medicine

## 2024-05-13 ENCOUNTER — Ambulatory Visit: Attending: Vascular Surgery | Admitting: Physician Assistant

## 2024-05-13 ENCOUNTER — Ambulatory Visit (INDEPENDENT_AMBULATORY_CARE_PROVIDER_SITE_OTHER): Admitting: Podiatry

## 2024-05-13 VITALS — BP 129/87 | HR 94 | Temp 98.0°F | Wt 266.7 lb

## 2024-05-13 DIAGNOSIS — I70221 Atherosclerosis of native arteries of extremities with rest pain, right leg: Secondary | ICD-10-CM

## 2024-05-13 DIAGNOSIS — Z91199 Patient's noncompliance with other medical treatment and regimen due to unspecified reason: Secondary | ICD-10-CM

## 2024-05-13 NOTE — Progress Notes (Signed)
  POST OPERATIVE OFFICE NOTE    CC:  F/u for surgery  HPI:  Fred Green is a 48 y.o. male who is here for wound check.  He recently underwent irrigation and debridement of the right leg on 02/20/2024 by Dr. Vikki Graves.  At his last visit with our office his medial leg wound was healing appropriately.  His wound VAC was discontinued and he was started on daily wet-to-dry dressing changes.  He returns today for follow-up and wound check.  Overall he is doing better.  He still has intermittent numbness in the right foot due to his neuropathy.  He has been taking care of his right lower leg wound daily.  He denies any puslike drainage.  He continues to have significant right lower leg and foot swelling after prolonged periods of activity or sitting.   No Known Allergies  Current Outpatient Medications  Medication Sig Dispense Refill   aspirin  EC 81 MG tablet Take 1 tablet (81 mg total) by mouth daily. Swallow whole. 30 tablet 12   clopidogrel  (PLAVIX ) 75 MG tablet Take 1 tablet (75 mg total) by mouth daily at 6 (six) AM. 30 tablet 11   gabapentin  (NEURONTIN ) 300 MG capsule Take 1 capsule (300 mg total) by mouth 3 (three) times daily as needed. 90 capsule 1   ibuprofen  (ADVIL ,MOTRIN ) 200 MG tablet Take 400 mg by mouth every 6 (six) hours as needed for mild pain (pain score 1-3) or moderate pain (pain score 4-6).     Insulin  Lispro Prot & Lispro (HUMALOG  75/25 MIX) (75-25) 100 UNIT/ML Kwikpen Inject 10 Units into the skin 2 (two) times daily with a meal. 15 mL 0   Insulin  Pen Needle 31G X 8 MM MISC Use as directed 3 (three) times daily. 100 each 0   metFORMIN  (GLUCOPHAGE ) 500 MG tablet Take 1 tablet (500 mg total) by mouth 2 (two) times daily with a meal. 60 tablet 3   rosuvastatin  (CRESTOR ) 20 MG tablet Take 1 tablet (20 mg total) by mouth daily. 30 tablet 11   No current facility-administered medications for this visit.     ROS:  See HPI  Physical Exam:  Incision:  nearly healed right medial  calf fasciotomy site. Small, healing ulcerations to the lateral right calf Extremities:  palpable right PT pulse  Neuro: intact motor of right foot         Assessment/Plan:  This is a 48 y.o. male who is here for wound check  - The patient recently underwent debridement of his right medial calf fasciotomy site.  This was performed after right lower extremity thrombectomy and 4 compartment fasciotomies in February for acute limb ischemia - His right medial fasciotomy site is significantly smaller and is healthy without signs of infection - His right lateral fasciotomy site is healed, however he does have some small venous ulcerations around this area - His leg is warm well-perfused with a 2+ PT pulse - I have recommended continued wound care to the right lower leg, including wet-to-dry dressing changes to the medial fasciotomy site.  He will continue to use an Ace bandage for compression to the leg. - He can follow-up with our office in 1 to 2 months for repeat wound check and right lower extremity arterial duplex and ABIs   Deneise Finlay, PA-C [facility] [phone]   Call MD: Fulton Job

## 2024-05-13 NOTE — Progress Notes (Signed)
 1. No-show for appointment

## 2024-05-14 ENCOUNTER — Other Ambulatory Visit: Payer: Self-pay | Admitting: *Deleted

## 2024-05-14 DIAGNOSIS — I70221 Atherosclerosis of native arteries of extremities with rest pain, right leg: Secondary | ICD-10-CM

## 2024-05-27 ENCOUNTER — Telehealth: Payer: Self-pay

## 2024-05-27 NOTE — Telephone Encounter (Signed)
 Received fax from [employer] regarding pt savings through filling with them for Crestor . I called pt and he is content with where he is getting this from. No changes to be made.

## 2024-06-02 ENCOUNTER — Ambulatory Visit (INDEPENDENT_AMBULATORY_CARE_PROVIDER_SITE_OTHER): Admitting: Podiatry

## 2024-06-02 DIAGNOSIS — Z91198 Patient's noncompliance with other medical treatment and regimen for other reason: Secondary | ICD-10-CM

## 2024-06-07 NOTE — Progress Notes (Signed)
 1. Failure to attend appointment with reason given   Appointment rescheduled.

## 2024-06-11 ENCOUNTER — Ambulatory Visit (INDEPENDENT_AMBULATORY_CARE_PROVIDER_SITE_OTHER): Admitting: Internal Medicine

## 2024-06-11 ENCOUNTER — Other Ambulatory Visit (HOSPITAL_COMMUNITY): Payer: Self-pay

## 2024-06-11 VITALS — BP 128/70 | HR 94 | Ht 76.0 in | Wt 267.0 lb

## 2024-06-11 DIAGNOSIS — E119 Type 2 diabetes mellitus without complications: Secondary | ICD-10-CM

## 2024-06-11 DIAGNOSIS — I739 Peripheral vascular disease, unspecified: Secondary | ICD-10-CM | POA: Diagnosis not present

## 2024-06-11 DIAGNOSIS — E785 Hyperlipidemia, unspecified: Secondary | ICD-10-CM

## 2024-06-11 DIAGNOSIS — R002 Palpitations: Secondary | ICD-10-CM | POA: Diagnosis not present

## 2024-06-11 DIAGNOSIS — I5030 Unspecified diastolic (congestive) heart failure: Secondary | ICD-10-CM

## 2024-06-11 MED ORDER — METOPROLOL SUCCINATE ER 25 MG PO TB24: 12.5000 mg | ORAL_TABLET | Freq: Every evening | ORAL | 3 refills | Status: AC

## 2024-06-11 NOTE — Patient Instructions (Signed)
 Medication Instructions:  START Metoprolol  Succinate (TOPROL  XL) 12.5 mg (1/2 tablet) every night  *If you need a refill on your cardiac medications before your next appointment, please call your pharmacy*  Lab Work: FASTING labs - NMR and LPa today If you have labs (blood work) drawn today and your tests are completely normal, you will receive your results only by: MyChart Message (if you have MyChart) OR A paper copy in the mail If you have any lab test that is abnormal or we need to change your treatment, we will call you to review the results.   Follow-Up: At [facility], you and your health needs are our priority.  As part of our continuing mission to provide you with exceptional heart care, our providers are all part of one team.  This team includes your primary Cardiologist (physician) and Advanced Practice Providers or APPs (Physician Assistants and Nurse Practitioners) who all work together to provide you with the care you need, when you need it.  Your next appointment:   In 6 months with Slater Duncan NP in Lipid Clinic

## 2024-06-11 NOTE — Progress Notes (Signed)
 LIPID CLINIC CONSULT NOTE  Chief Complaint:  Manage dyslipidemia  Primary Care Physician: Pcp, No  Primary Cardiologist:  None  HPI:  Fred Green is a 48 y.o. male who is being seen today for the evaluation of dyslipidemia at the request of Fred Green*.  This is a 48 year old male kindly referred for evaluation management of dyslipidemia.  Surprisingly he had presented.  Recently with critical limb ischemia was found to have significant PAD.  He underwent right lower extremity thromboembolectomy and stenting and had ultimately a compartment syndrome requiring fasciotomy.  He was noted to have high cholesterol with lipids showing total cholesterol 215, triglycerides 116, HDL 35 and LDL 157.  All this took place in February 2025 but he had his wife notes since then he has made substantial dietary lifestyle changes including significant weight loss of over 100 pounds for his he is currently weighing 267 pounds), marked changes in his diet including significant reduction in fast foods and hamburgers, he was able to stop smoking and has remained abstinent.  He also walks very regularly which is good for his PAD.  He was started on statin therapy, appropriately rosuvastatin  20 mg daily but has not had repeat lipid testing since then.  He does report cardiovascular disease and diabetes in the family.  Has reported intermittent palpitations but no chest pain.  Echo was performed during his hospitalization which showed LVEF 50 to 55%.  PMHx:  Past Medical History:  Diagnosis Date   Diabetes mellitus without complication (HCC)    GERD (gastroesophageal reflux disease)    Gout    Peripheral arterial disease (HCC)    Peripheral vascular disease (HCC)    Tobacco abuse     Past Surgical History:  Procedure Laterality Date   APPLICATION OF WOUND VAC Right 02/20/2024   Procedure: APPLICATION OF WOUND VAC;  Surgeon: Fred Hoof, MD;  Location: [facility] OR;  Service: Vascular;   Laterality: Right;   FASCIOTOMY Right 01/26/2024   Procedure: RIGHT LOWER LEG FOUR COMPARTMENT FASCIOTOMY;  Surgeon: Fred Hoof, MD;  Location: [facility] OR;  Service: Vascular;  Laterality: Right;   INCISION AND DRAINAGE OF WOUND Right 02/20/2024   Procedure: IRRIGATION AND DEBRIDEMENT WOUND RIGHT LEG;  Surgeon: Fred Hoof, MD;  Location: [facility] OR;  Service: Vascular;  Laterality: Right;   INSERTION OF ILIAC STENT Right 01/26/2024   Procedure: INSERTION OF RIGHT POPITEAL ARTERY STENT;  Surgeon: Fred Hoof, MD;  Location: [facility] OR;  Service: Vascular;  Laterality: Right;   LOWER EXTREMITY ANGIOGRAM Right 01/26/2024   Procedure: RIGHT LOWER EXTREMITY ANGIOGRAM;  Surgeon: Fred Hoof, MD;  Location: [facility] OR;  Service: Vascular;  Laterality: Right;   NO PAST SURGERIES     PATCH ANGIOPLASTY Right 01/26/2024   Procedure: PATCH ANGIOPLASTY USING Hazeline Lister;  Surgeon: Fred Hoof, MD;  Location: [facility] OR;  Service: Vascular;  Laterality: Right;   THROMBECTOMY OF BYPASS GRAFT FEMORAL- POPLITEAL ARTERY Right 01/26/2024   Procedure: RIGHT LOWER EXTREMITY THROMBECTOMY;  Surgeon: Fred Hoof, MD;  Location: [facility] OR;  Service: Vascular;  Laterality: Right;    FAMHx:  Family History  Problem Relation Age of Onset   Diabetes Mother    Stroke Mother    Cancer Mother    Heart failure Father    Heart attack Father     SOCHx:   reports that he quit smoking about 4 months ago. His smoking use included cigarettes. He does not have any smokeless  tobacco history on file. He reports that he does not drink alcohol and does not use drugs.  ALLERGIES:  No Known Allergies  ROS: Pertinent items noted in HPI and remainder of comprehensive ROS otherwise negative.  HOME MEDS: Current Outpatient Medications on File Prior to Visit  Medication Sig Dispense Refill   aspirin  EC 81 MG tablet Take 1 tablet (81 mg total) by mouth daily. Swallow whole. 30  tablet 12   clopidogrel  (PLAVIX ) 75 MG tablet Take 1 tablet (75 mg total) by mouth daily at 6 (six) AM. 30 tablet 11   gabapentin  (NEURONTIN ) 300 MG capsule Take 1 capsule (300 mg total) by mouth 3 (three) times daily as needed. 90 capsule 1   hydrochlorothiazide (HYDRODIURIL) 25 MG tablet Take 25 mg by mouth daily.     metFORMIN  (GLUCOPHAGE ) 500 MG tablet Take 1 tablet (500 mg total) by mouth 2 (two) times daily with a meal. 60 tablet 3   rosuvastatin  (CRESTOR ) 20 MG tablet Take 1 tablet (20 mg total) by mouth daily. 30 tablet 11   sertraline (ZOLOFT) 50 MG tablet Take 50 mg by mouth daily.     No current facility-administered medications on file prior to visit.    LABS/IMAGING: No results found for this or any previous visit (from the past 48 hours). No results found.  LIPID PANEL:    Component Value Date/Time   CHOL 215 (H) 01/27/2024 0342   TRIG 116 01/27/2024 0342   HDL 35 (L) 01/27/2024 0342   CHOLHDL 6.1 01/27/2024 0342   VLDL 23 01/27/2024 0342   LDLCALC 157 (H) 01/27/2024 0342   No results found for: LIPOA    WEIGHTS: Wt Readings from Last 3 Encounters:  06/11/24 267 lb (121.1 kg)  05/13/24 266 lb 11.2 oz (121 kg)  04/15/24 267 lb 3.2 oz (121.2 kg)    VITALS: BP 128/70 (BP Location: Left Arm, Patient Position: Sitting, Cuff Size: Normal)   Pulse 94   Ht 6' 4 (1.93 m)   Wt 267 lb (121.1 kg)   BMI 32.50 kg/m   EXAM: General appearance: alert and no distress Lungs: clear to auscultation bilaterally Heart: regular rate and rhythm, S1, S2 normal, no murmur, click, rub or gallop Extremities: extremities normal, atraumatic, no cyanosis or edema Neurologic: Grossly normal  EKG: Deferred  ASSESSMENT: PAD with critical limb ischemia status post right lower limb thrombectomy and stenting with fasciotomy Dyslipidemia, goal LDL less than 70 Type 2 diabetes Obesity with recent significant weight loss Palpitations LVEF 50 to 55%  PLAN: 1.   Mr. Redner had  unfortunately critical limb ischemia and required a number of procedures to improve blood flow to his leg.  Since then he has stopped smoking, had significant weight loss, is compliant with dual antiplatelet therapy and high intensity statin, and has made significant dietary changes.  I suspect his cholesterol has already improved substantially.  I would like to check an NMR and LP(a).  If he is not yet at target he might qualify for additional therapies.  He has reported some palpitations that are infrequent.  Of note his echo showed LVEF 50 to 55% which is low normal and heart rate was in the 90s today.  I advise starting Toprol -XL 12.5 mg nightly.  He will need a repeat echo likely in a year to see if his LVEF has improved.  Would consider additional GDMT at follow-up including possible SGLT2 inhibitor.  Follow-up with our prevention APP in 6 months or sooner if necessary.  Hazle Lites, MD, [facility], FNLA, FACP  [place]  [facility]  Medical Director of the Advanced Lipid Disorders &  Cardiovascular Risk Reduction Clinic Diplomate of the American Board of Clinical Lipidology Attending Cardiologist  Direct Dial: [phone]  Fax: [phone]  Website:  [email or web address] Leobardo Granlund 06/11/2024, 8:45 AM

## 2024-06-15 ENCOUNTER — Ambulatory Visit (HOSPITAL_BASED_OUTPATIENT_CLINIC_OR_DEPARTMENT_OTHER): Payer: Self-pay | Admitting: Internal Medicine

## 2024-06-15 LAB — NMR, LIPOPROFILE
Cholesterol, Total: 115 mg/dL (ref 100–199)
HDL Particle Number: 27.7 umol/L — ABNORMAL LOW (ref >=30.5)
HDL-C: 39 mg/dL — ABNORMAL LOW (ref >39)
LDL Particle Number: 836 nmol/L (ref <1000)
LDL Size: 20.2 nm — ABNORMAL LOW (ref >20.5)
LDL-C (NIH Calc): 66 mg/dL (ref 0–99)
LP-IR Score: 47 — ABNORMAL HIGH (ref <=45)
Small LDL Particle Number: 504 nmol/L (ref <=527)
Triglycerides: 40 mg/dL (ref 0–149)

## 2024-06-15 LAB — LIPOPROTEIN A (LPA): Lipoprotein (a): 31.5 nmol/L (ref <75.0)

## 2024-06-24 ENCOUNTER — Other Ambulatory Visit: Payer: Self-pay | Admitting: Physician Assistant

## 2024-06-25 ENCOUNTER — Other Ambulatory Visit: Payer: Self-pay

## 2024-07-15 ENCOUNTER — Other Ambulatory Visit: Payer: Self-pay

## 2024-07-15 ENCOUNTER — Encounter: Payer: Self-pay | Admitting: Vascular Surgery

## 2024-07-15 ENCOUNTER — Ambulatory Visit (HOSPITAL_BASED_OUTPATIENT_CLINIC_OR_DEPARTMENT_OTHER)
Admission: RE | Admit: 2024-07-15 | Discharge: 2024-07-15 | Disposition: A | Discharge status: Home / Self Care | Source: Ambulatory Visit | Attending: Vascular Surgery | Admitting: Vascular Surgery

## 2024-07-15 ENCOUNTER — Ambulatory Visit (HOSPITAL_COMMUNITY)
Admission: RE | Admit: 2024-07-15 | Discharge: 2024-07-15 | Disposition: A | Discharge status: Home / Self Care | Source: Ambulatory Visit | Attending: Vascular Surgery | Admitting: Vascular Surgery

## 2024-07-15 ENCOUNTER — Ambulatory Visit (INDEPENDENT_AMBULATORY_CARE_PROVIDER_SITE_OTHER): Admitting: Physician Assistant

## 2024-07-15 VITALS — BP 127/78 | HR 88 | Temp 98.0°F | Wt 279.6 lb

## 2024-07-15 DIAGNOSIS — I70221 Atherosclerosis of native arteries of extremities with rest pain, right leg: Secondary | ICD-10-CM

## 2024-07-15 DIAGNOSIS — I872 Venous insufficiency (chronic) (peripheral): Secondary | ICD-10-CM | POA: Insufficient documentation

## 2024-07-15 DIAGNOSIS — Z9889 Other specified postprocedural states: Secondary | ICD-10-CM

## 2024-07-15 DIAGNOSIS — I739 Peripheral vascular disease, unspecified: Secondary | ICD-10-CM

## 2024-07-15 LAB — VAS US ABI WITH/WO TBI
Left ABI: 1.03
Right ABI: 0.75

## 2024-07-15 NOTE — H&P (View-Only) (Signed)
 Office Note     CC:  follow up Requesting Provider:  Elna Ahmed SQUIBB, PA-C  HPI: Fred Green is a 48 y.o. (03/03/1976) male who presents for follow up of PAD with non invasive studies. He is status post right lower extremity thrombectomy via right below knee popliteal artery exposure, right popliteal and TPT endarterectomy with bovine pericardial patch angioplasty, Right lower extremity Angiogram with stenting of the right SFA and popliteal artery as well as right lower extremity 4 compartment fasciotomies with wound VAC application on 01/26/24. He subsequently underwent excisional debridement on 02/20/24 due to necrosis of skin along his fasciotomy sites. He has since healed from all of this.   Today he reports continued intermittent shooting and burning pains in right leg, mostly along shin. Also pain shooting into right great toe. These normally occur at rest. Do not wake him up at night. Rarely occurs on ambulation. He says he has been really trying to walk more. His balance however has been off since his surgery. He still has intermittent numbness in the right foot due to his neuropathy. He currently takes Neurontin  for this. He continues to have significant right lower leg and foot swelling after prolonged periods of activity or sitting. He says he elevates but admits that it is not very high. He is medically managed on Aspirin , Plavix  and statin.   Past Medical History:  Diagnosis Date   Diabetes mellitus without complication (HCC)    GERD (gastroesophageal reflux disease)    Gout    Peripheral arterial disease (HCC)    Peripheral vascular disease (HCC)    Tobacco abuse     Past Surgical History:  Procedure Laterality Date   APPLICATION OF WOUND VAC Right 02/20/2024   Procedure: APPLICATION OF WOUND VAC;  Surgeon: Sheree Penne Bruckner, MD;  Location: [facility] OR;  Service: Vascular;  Laterality: Right;   FASCIOTOMY Right 01/26/2024   Procedure: RIGHT LOWER LEG FOUR COMPARTMENT  FASCIOTOMY;  Surgeon: Sheree Penne Bruckner, MD;  Location: [facility] OR;  Service: Vascular;  Laterality: Right;   INCISION AND DRAINAGE OF WOUND Right 02/20/2024   Procedure: IRRIGATION AND DEBRIDEMENT WOUND RIGHT LEG;  Surgeon: Sheree Penne Bruckner, MD;  Location: [facility] OR;  Service: Vascular;  Laterality: Right;   INSERTION OF ILIAC STENT Right 01/26/2024   Procedure: INSERTION OF RIGHT POPITEAL ARTERY STENT;  Surgeon: Sheree Penne Bruckner, MD;  Location: [facility] OR;  Service: Vascular;  Laterality: Right;   LOWER EXTREMITY ANGIOGRAM Right 01/26/2024   Procedure: RIGHT LOWER EXTREMITY ANGIOGRAM;  Surgeon: Sheree Penne Bruckner, MD;  Location: [facility] OR;  Service: Vascular;  Laterality: Right;   NO PAST SURGERIES     PATCH ANGIOPLASTY Right 01/26/2024   Procedure: PATCH ANGIOPLASTY USING GEORGE LISLE;  Surgeon: Sheree Penne Bruckner, MD;  Location: [facility] OR;  Service: Vascular;  Laterality: Right;   THROMBECTOMY OF BYPASS GRAFT FEMORAL- POPLITEAL ARTERY Right 01/26/2024   Procedure: RIGHT LOWER EXTREMITY THROMBECTOMY;  Surgeon: Sheree Penne Bruckner, MD;  Location: [facility] OR;  Service: Vascular;  Laterality: Right;    Social History   Socioeconomic History   Marital status: Significant Other    Spouse name: Not on file   Number of children: Not on file   Years of education: Not on file   Highest education level: Not on file  Occupational History   Not on file  Tobacco Use   Smoking status: Former    Current packs/day: 0.00    Types: Cigarettes    Quit date: 01/25/2024  Years since quitting: 0.4   Smokeless tobacco: Not on file  Vaping Use   Vaping status: Never Used  Substance and Sexual Activity   Alcohol use: No   Drug use: Never   Sexual activity: Yes  Other Topics Concern   Not on file  Social History Narrative   Not on file   Social Drivers of Health   Financial Resource Strain: Not on file  Food Insecurity: Food Insecurity Present (01/27/2024)   Hunger Vital Sign     Worried About Running Out of Food in the Last Year: Sometimes true    Ran Out of Food in the Last Year: Not on file  Transportation Needs: Not on file  Physical Activity: Not on file  Stress: Not on file  Social Connections: Not on file  Intimate Partner Violence: Not on file   Family History  Problem Relation Age of Onset   Diabetes Mother    Stroke Mother    Cancer Mother    Heart failure Father    Heart attack Father     Current Outpatient Medications  Medication Sig Dispense Refill   aspirin  EC 81 MG tablet Take 1 tablet (81 mg total) by mouth daily. Swallow whole. 30 tablet 12   clopidogrel  (PLAVIX ) 75 MG tablet Take 1 tablet (75 mg total) by mouth daily at 6 (six) AM. 30 tablet 11   gabapentin  (NEURONTIN ) 300 MG capsule Take 1 capsule (300 mg total) by mouth 3 (three) times daily as needed. 90 capsule 1   gabapentin  (NEURONTIN ) 400 MG capsule Take 400 mg by mouth daily.     hydrochlorothiazide (HYDRODIURIL) 25 MG tablet Take 25 mg by mouth daily.     metFORMIN  (GLUCOPHAGE ) 500 MG tablet Take 1 tablet (500 mg total) by mouth 2 (two) times daily with a meal. 60 tablet 3   metoprolol  succinate (TOPROL  XL) 25 MG 24 hr tablet Take 0.5 tablets (12.5 mg total) by mouth at bedtime. 90 tablet 3   rosuvastatin  (CRESTOR ) 20 MG tablet Take 1 tablet (20 mg total) by mouth daily. 30 tablet 11   sertraline (ZOLOFT) 50 MG tablet Take 50 mg by mouth daily.     No current facility-administered medications for this visit.    No Known Allergies   REVIEW OF SYSTEMS:   [X]  denotes positive finding, [ ]  denotes negative finding Cardiac  Comments:  Chest pain or chest pressure:    Shortness of breath upon exertion:    Short of breath when lying flat:    Irregular heart rhythm:        Vascular    Pain in calf, thigh, or hip brought on by ambulation:    Pain in feet at night that wakes you up from your sleep:     Blood clot in your veins:    Leg swelling:         Pulmonary    Oxygen at  home:    Productive cough:     Wheezing:         Neurologic    Sudden weakness in arms or legs:     Sudden numbness in arms or legs:     Sudden onset of difficulty speaking or slurred speech:    Temporary loss of vision in one eye:     Problems with dizziness:         Gastrointestinal    Blood in stool:     Vomited blood:         Genitourinary  Burning when urinating:     Blood in urine:        Psychiatric    Major depression:         Hematologic    Bleeding problems:    Problems with blood clotting too easily:        Skin    Rashes or ulcers:        Constitutional    Fever or chills:      PHYSICAL EXAMINATION:  Vitals:   07/15/24 0951  BP: 127/78  Pulse: 88  Temp: 98 F (36.7 C)  TempSrc: Temporal  Weight: 279 lb 9.6 oz (126.8 kg)    General:  WDWN in NAD; vital signs documented above Gait: Not observed HENT: WNL, normocephalic Pulmonary: normal non-labored breathing Cardiac: regular HR Abdomen: soft Vascular Exam/Pulses: 2+ femoral, Doppler DP/ PT signals Extremities: without ischemic changes, without Gangrene , without cellulitis; without open wounds;  Musculoskeletal: no muscle wasting or atrophy  Neurologic: A&O X 3 Psychiatric:  The pt has Normal affect.   Non-Invasive Vascular Imaging:   +-------+-----------+-----------+------------+------------+  ABI/TBIToday's ABIToday's TBIPrevious ABIPrevious TBI  +-------+-----------+-----------+------------+------------+  Right 0.75       0          1.15        0.43          +-------+-----------+-----------+------------+------------+  Left  1.03       0.47       1.19        0             +-------+-----------+-----------+------------+------------+ Right ABIs and TBIs appear decreased compared to prior study on 02/04/24.  Left TBIs appear increased compared to prior study on 02/04/24.    VAS US  Lower extremity arterial duplex: Summary:  Right: 30-49% stenosis noted in the superficial  femoral artery. Patent stent with no evidence of stenosis in the popliteal artery artery. 50-74% stenosis noted in the TP Trunk (PSV 272 cm/s)   ASSESSMENT/PLAN:: 48 y.o. male here for follow up of PAD with non invasive studies. He is status post right lower extremity thrombectomy via right below knee popliteal artery exposure, right popliteal and TPT endarterectomy with bovine pericardial patch angioplasty, Right lower extremity Angiogram with stenting of the right SFA and popliteal artery as well as right lower extremity 4 compartment fasciotomies with wound VAC application on 01/26/24. He subsequently underwent excisional debridement on 02/20/24 due to necrosis of skin along his fasciotomy sites. He has since healed from all of this. He continues to have a lot of neuropathic pain in right leg that comes and goes and pain radiating into right great toe. No claudication or tissue loss.  - ABI/TBI today decreased significantly on the RLE . LLE stable from prior study - Duplex today shows some elevated velocities in the SFA, SFA stent is patent but there is 50-74% stenosis in the TP Trunk  - Continue Aspirin , Statin, Plavix  - Will arrange for Aortogram, arteriogram of RLE with possible SFA/ TP Trunk Angioplasty and or stenting in the near future with Dr. Sheree Teretha Damme, PA-C [facility] [phone]  On call MD:   Magda

## 2024-07-15 NOTE — Progress Notes (Signed)
 Office Note     CC:  follow up Requesting Provider:  Elna Ahmed SQUIBB, PA-C  HPI: Fred Green is a 48 y.o. (03/03/1976) male who presents for follow up of PAD with non invasive studies. He is status post right lower extremity thrombectomy via right below knee popliteal artery exposure, right popliteal and TPT endarterectomy with bovine pericardial patch angioplasty, Right lower extremity Angiogram with stenting of the right SFA and popliteal artery as well as right lower extremity 4 compartment fasciotomies with wound VAC application on 01/26/24. He subsequently underwent excisional debridement on 02/20/24 due to necrosis of skin along his fasciotomy sites. He has since healed from all of this.   Today he reports continued intermittent shooting and burning pains in right leg, mostly along shin. Also pain shooting into right great toe. These normally occur at rest. Do not wake him up at night. Rarely occurs on ambulation. He says he has been really trying to walk more. His balance however has been off since his surgery. He still has intermittent numbness in the right foot due to his neuropathy. He currently takes Neurontin  for this. He continues to have significant right lower leg and foot swelling after prolonged periods of activity or sitting. He says he elevates but admits that it is not very high. He is medically managed on Aspirin , Plavix  and statin.   Past Medical History:  Diagnosis Date   Diabetes mellitus without complication (HCC)    GERD (gastroesophageal reflux disease)    Gout    Peripheral arterial disease (HCC)    Peripheral vascular disease (HCC)    Tobacco abuse     Past Surgical History:  Procedure Laterality Date   APPLICATION OF WOUND VAC Right 02/20/2024   Procedure: APPLICATION OF WOUND VAC;  Surgeon: Sheree Penne Bruckner, MD;  Location: [facility] OR;  Service: Vascular;  Laterality: Right;   FASCIOTOMY Right 01/26/2024   Procedure: RIGHT LOWER LEG FOUR COMPARTMENT  FASCIOTOMY;  Surgeon: Sheree Penne Bruckner, MD;  Location: [facility] OR;  Service: Vascular;  Laterality: Right;   INCISION AND DRAINAGE OF WOUND Right 02/20/2024   Procedure: IRRIGATION AND DEBRIDEMENT WOUND RIGHT LEG;  Surgeon: Sheree Penne Bruckner, MD;  Location: [facility] OR;  Service: Vascular;  Laterality: Right;   INSERTION OF ILIAC STENT Right 01/26/2024   Procedure: INSERTION OF RIGHT POPITEAL ARTERY STENT;  Surgeon: Sheree Penne Bruckner, MD;  Location: [facility] OR;  Service: Vascular;  Laterality: Right;   LOWER EXTREMITY ANGIOGRAM Right 01/26/2024   Procedure: RIGHT LOWER EXTREMITY ANGIOGRAM;  Surgeon: Sheree Penne Bruckner, MD;  Location: [facility] OR;  Service: Vascular;  Laterality: Right;   NO PAST SURGERIES     PATCH ANGIOPLASTY Right 01/26/2024   Procedure: PATCH ANGIOPLASTY USING GEORGE LISLE;  Surgeon: Sheree Penne Bruckner, MD;  Location: [facility] OR;  Service: Vascular;  Laterality: Right;   THROMBECTOMY OF BYPASS GRAFT FEMORAL- POPLITEAL ARTERY Right 01/26/2024   Procedure: RIGHT LOWER EXTREMITY THROMBECTOMY;  Surgeon: Sheree Penne Bruckner, MD;  Location: [facility] OR;  Service: Vascular;  Laterality: Right;    Social History   Socioeconomic History   Marital status: Significant Other    Spouse name: Not on file   Number of children: Not on file   Years of education: Not on file   Highest education level: Not on file  Occupational History   Not on file  Tobacco Use   Smoking status: Former    Current packs/day: 0.00    Types: Cigarettes    Quit date: 01/25/2024  Years since quitting: 0.4   Smokeless tobacco: Not on file  Vaping Use   Vaping status: Never Used  Substance and Sexual Activity   Alcohol use: No   Drug use: Never   Sexual activity: Yes  Other Topics Concern   Not on file  Social History Narrative   Not on file   Social Drivers of Health   Financial Resource Strain: Not on file  Food Insecurity: Food Insecurity Present (01/27/2024)   Hunger Vital Sign     Worried About Running Out of Food in the Last Year: Sometimes true    Ran Out of Food in the Last Year: Not on file  Transportation Needs: Not on file  Physical Activity: Not on file  Stress: Not on file  Social Connections: Not on file  Intimate Partner Violence: Not on file   Family History  Problem Relation Age of Onset   Diabetes Mother    Stroke Mother    Cancer Mother    Heart failure Father    Heart attack Father     Current Outpatient Medications  Medication Sig Dispense Refill   aspirin  EC 81 MG tablet Take 1 tablet (81 mg total) by mouth daily. Swallow whole. 30 tablet 12   clopidogrel  (PLAVIX ) 75 MG tablet Take 1 tablet (75 mg total) by mouth daily at 6 (six) AM. 30 tablet 11   gabapentin  (NEURONTIN ) 300 MG capsule Take 1 capsule (300 mg total) by mouth 3 (three) times daily as needed. 90 capsule 1   gabapentin  (NEURONTIN ) 400 MG capsule Take 400 mg by mouth daily.     hydrochlorothiazide (HYDRODIURIL) 25 MG tablet Take 25 mg by mouth daily.     metFORMIN  (GLUCOPHAGE ) 500 MG tablet Take 1 tablet (500 mg total) by mouth 2 (two) times daily with a meal. 60 tablet 3   metoprolol  succinate (TOPROL  XL) 25 MG 24 hr tablet Take 0.5 tablets (12.5 mg total) by mouth at bedtime. 90 tablet 3   rosuvastatin  (CRESTOR ) 20 MG tablet Take 1 tablet (20 mg total) by mouth daily. 30 tablet 11   sertraline (ZOLOFT) 50 MG tablet Take 50 mg by mouth daily.     No current facility-administered medications for this visit.    No Known Allergies   REVIEW OF SYSTEMS:   [X]  denotes positive finding, [ ]  denotes negative finding Cardiac  Comments:  Chest pain or chest pressure:    Shortness of breath upon exertion:    Short of breath when lying flat:    Irregular heart rhythm:        Vascular    Pain in calf, thigh, or hip brought on by ambulation:    Pain in feet at night that wakes you up from your sleep:     Blood clot in your veins:    Leg swelling:         Pulmonary    Oxygen at  home:    Productive cough:     Wheezing:         Neurologic    Sudden weakness in arms or legs:     Sudden numbness in arms or legs:     Sudden onset of difficulty speaking or slurred speech:    Temporary loss of vision in one eye:     Problems with dizziness:         Gastrointestinal    Blood in stool:     Vomited blood:         Genitourinary  Burning when urinating:     Blood in urine:        Psychiatric    Major depression:         Hematologic    Bleeding problems:    Problems with blood clotting too easily:        Skin    Rashes or ulcers:        Constitutional    Fever or chills:      PHYSICAL EXAMINATION:  Vitals:   07/15/24 0951  BP: 127/78  Pulse: 88  Temp: 98 F (36.7 C)  TempSrc: Temporal  Weight: 279 lb 9.6 oz (126.8 kg)    General:  WDWN in NAD; vital signs documented above Gait: Not observed HENT: WNL, normocephalic Pulmonary: normal non-labored breathing Cardiac: regular HR Abdomen: soft Vascular Exam/Pulses: 2+ femoral, Doppler DP/ PT signals Extremities: without ischemic changes, without Gangrene , without cellulitis; without open wounds;  Musculoskeletal: no muscle wasting or atrophy  Neurologic: A&O X 3 Psychiatric:  The pt has Normal affect.   Non-Invasive Vascular Imaging:   +-------+-----------+-----------+------------+------------+  ABI/TBIToday's ABIToday's TBIPrevious ABIPrevious TBI  +-------+-----------+-----------+------------+------------+  Right 0.75       0          1.15        0.43          +-------+-----------+-----------+------------+------------+  Left  1.03       0.47       1.19        0             +-------+-----------+-----------+------------+------------+ Right ABIs and TBIs appear decreased compared to prior study on 02/04/24.  Left TBIs appear increased compared to prior study on 02/04/24.    VAS US  Lower extremity arterial duplex: Summary:  Right: 30-49% stenosis noted in the superficial  femoral artery. Patent stent with no evidence of stenosis in the popliteal artery artery. 50-74% stenosis noted in the TP Trunk (PSV 272 cm/s)   ASSESSMENT/PLAN:: 48 y.o. male here for follow up of PAD with non invasive studies. He is status post right lower extremity thrombectomy via right below knee popliteal artery exposure, right popliteal and TPT endarterectomy with bovine pericardial patch angioplasty, Right lower extremity Angiogram with stenting of the right SFA and popliteal artery as well as right lower extremity 4 compartment fasciotomies with wound VAC application on 01/26/24. He subsequently underwent excisional debridement on 02/20/24 due to necrosis of skin along his fasciotomy sites. He has since healed from all of this. He continues to have a lot of neuropathic pain in right leg that comes and goes and pain radiating into right great toe. No claudication or tissue loss.  - ABI/TBI today decreased significantly on the RLE . LLE stable from prior study - Duplex today shows some elevated velocities in the SFA, SFA stent is patent but there is 50-74% stenosis in the TP Trunk  - Continue Aspirin , Statin, Plavix  - Will arrange for Aortogram, arteriogram of RLE with possible SFA/ TP Trunk Angioplasty and or stenting in the near future with Dr. Sheree Teretha Damme, PA-C [facility] [phone]  On call MD:   Magda

## 2024-07-21 ENCOUNTER — Ambulatory Visit: Admitting: Podiatry

## 2024-07-21 ENCOUNTER — Encounter: Payer: Self-pay | Admitting: Podiatry

## 2024-07-21 DIAGNOSIS — M79676 Pain in unspecified toe(s): Secondary | ICD-10-CM | POA: Diagnosis not present

## 2024-07-21 DIAGNOSIS — B351 Tinea unguium: Secondary | ICD-10-CM | POA: Diagnosis not present

## 2024-07-21 DIAGNOSIS — E119 Type 2 diabetes mellitus without complications: Secondary | ICD-10-CM

## 2024-07-21 DIAGNOSIS — Z8631 Personal history of diabetic foot ulcer: Secondary | ICD-10-CM | POA: Diagnosis not present

## 2024-07-21 DIAGNOSIS — G629 Polyneuropathy, unspecified: Secondary | ICD-10-CM

## 2024-07-21 DIAGNOSIS — E1151 Type 2 diabetes mellitus with diabetic peripheral angiopathy without gangrene: Secondary | ICD-10-CM

## 2024-07-21 DIAGNOSIS — L859 Epidermal thickening, unspecified: Secondary | ICD-10-CM | POA: Diagnosis not present

## 2024-07-21 NOTE — Patient Instructions (Signed)
 Apply Revitaderm Cream  before bedtime to rough skin everyday. Do not apply to good skin.  Apply cocoa butter or lotion to both feet once daily every morning.  Use your pumice stone once weekly on rough skin after bath or shower.  Do not use sharp instruments or razors on feet.

## 2024-07-23 DIAGNOSIS — E119 Type 2 diabetes mellitus without complications: Secondary | ICD-10-CM | POA: Insufficient documentation

## 2024-07-23 NOTE — Progress Notes (Signed)
 ANNUAL DIABETIC FOOT EXAM  Subjective: Fred Green presents today for annual diabetic foot exam. He is followed by Vascular Surgery. Chief Complaint  Patient presents with   RFC    Rm17 Diabetic foot care/ A1C 5/Dr. Sebastian Last visit June 30 2024   Patient confirms h/o diabetes.  Patient has h/o ulcer of right great toe.  Patient has been diagnosed with neuropathy.  Fred Jerilyn HERO, FNP is patient's PCP.  Past Medical History:  Diagnosis Date   Diabetes mellitus without complication (HCC)    GERD (gastroesophageal reflux disease)    Gout    Peripheral arterial disease (HCC)    Peripheral vascular disease (HCC)    Tobacco abuse    Patient Active Problem List   Diagnosis Date Noted   Diabetes (HCC)    PAD (peripheral artery disease) (HCC) 02/20/2024   Vascular complication 01/26/2024   Critical limb ischemia of right lower extremity (HCC) 01/26/2024   Back pain 01/11/2016   Tobacco abuse 01/11/2016   Abnormal EKG 01/11/2016   GERD (gastroesophageal reflux disease)    Gastroesophageal reflux disease without esophagitis    Past Surgical History:  Procedure Laterality Date   APPLICATION OF WOUND VAC Right 02/20/2024   Procedure: APPLICATION OF WOUND VAC;  Surgeon: Fred Penne Bruckner, MD;  Location: [facility] OR;  Service: Vascular;  Laterality: Right;   FASCIOTOMY Right 01/26/2024   Procedure: RIGHT LOWER LEG FOUR COMPARTMENT FASCIOTOMY;  Surgeon: Fred Penne Bruckner, MD;  Location: [facility] OR;  Service: Vascular;  Laterality: Right;   INCISION AND DRAINAGE OF WOUND Right 02/20/2024   Procedure: IRRIGATION AND DEBRIDEMENT WOUND RIGHT LEG;  Surgeon: Fred Penne Bruckner, MD;  Location: [facility] OR;  Service: Vascular;  Laterality: Right;   INSERTION OF ILIAC STENT Right 01/26/2024   Procedure: INSERTION OF RIGHT POPITEAL ARTERY STENT;  Surgeon: Fred Penne Bruckner, MD;  Location: [facility] OR;  Service: Vascular;  Laterality: Right;   LOWER EXTREMITY ANGIOGRAM Right  01/26/2024   Procedure: RIGHT LOWER EXTREMITY ANGIOGRAM;  Surgeon: Fred Penne Bruckner, MD;  Location: [facility] OR;  Service: Vascular;  Laterality: Right;   NO PAST SURGERIES     PATCH ANGIOPLASTY Right 01/26/2024   Procedure: PATCH ANGIOPLASTY USING Fred Green;  Surgeon: Fred Penne Bruckner, MD;  Location: [facility] OR;  Service: Vascular;  Laterality: Right;   THROMBECTOMY OF BYPASS GRAFT FEMORAL- POPLITEAL ARTERY Right 01/26/2024   Procedure: RIGHT LOWER EXTREMITY THROMBECTOMY;  Surgeon: Fred Penne Bruckner, MD;  Location: [facility] OR;  Service: Vascular;  Laterality: Right;   Current Outpatient Medications on File Prior to Visit  Medication Sig Dispense Refill   aspirin  EC 81 MG tablet Take 1 tablet (81 mg total) by mouth daily. Swallow whole. 30 tablet 12   clopidogrel  (PLAVIX ) 75 MG tablet Take 1 tablet (75 mg total) by mouth daily at 6 (six) AM. 30 tablet 11   gabapentin  (NEURONTIN ) 300 MG capsule Take 1 capsule (300 mg total) by mouth 3 (three) times daily as needed. 90 capsule 1   gabapentin  (NEURONTIN ) 400 MG capsule Take 400 mg by mouth daily.     hydrochlorothiazide (HYDRODIURIL) 25 MG tablet Take 25 mg by mouth daily.     metFORMIN  (GLUCOPHAGE ) 500 MG tablet Take 1 tablet (500 mg total) by mouth 2 (two) times daily with a meal. 60 tablet 3   metoprolol  succinate (TOPROL  XL) 25 MG 24 hr tablet Take 0.5 tablets (12.5 mg total) by mouth at bedtime. 90 tablet 3   rosuvastatin  (CRESTOR ) 20 MG tablet  Take 1 tablet (20 mg total) by mouth daily. 30 tablet 11   sertraline (ZOLOFT) 50 MG tablet Take 50 mg by mouth daily.     No current facility-administered medications on file prior to visit.    No Known Allergies Social History   Occupational History   Not on file  Tobacco Use   Smoking status: Former    Current packs/day: 0.00    Types: Cigarettes    Quit date: 01/25/2024    Years since quitting: 0.4   Smokeless tobacco: Not on file  Vaping Use   Vaping status: Never Used   Substance and Sexual Activity   Alcohol use: No   Drug use: Never   Sexual activity: Yes   Family History  Problem Relation Age of Onset   Diabetes Mother    Stroke Mother    Cancer Mother    Heart failure Father    Heart attack Father    Immunization History  Administered Date(s) Administered   Tdap 10/07/2014     Review of Systems: Negative except as noted in the HPI.   Objective: There were no vitals filed for this visit.  Fred Green is a pleasant 48 y.o. male in NAD. AAO X 3.  Diabetic foot exam was performed with the following findings:   Vascular Examination: Capillary refill time immediate b/l. Vascular status intact b/l with diminished pedal pulses b/l.  Pedal hair absent b/l. No pain with calf compression b/l. Skin temperature gradient WNL b/l. No cyanosis or clubbing b/l. No ischemia or gangrene noted b/l.   Neurological Examination: Sensation grossly intact b/l with 10 gram monofilament. Vibratory sensation intact b/l. Pt has subjective symptoms of neuropathy.  Dermatological Examination:  Severe xerosis with thickened plaques plantarly b/l LE. Healed ulceration plantar distal tip of right hallux.   No open wounds. No interdigital macerations.   Toenails 1-5 b/l thick, discolored, elongated with subungual debris and pain on dorsal palpation.   Musculoskeletal Examination: Muscle strength 5/5 to all lower extremity muscle groups bilaterally. No pain, crepitus or joint limitation noted with ROM bilateral LE.  Radiographs: None     Lab Results  Component Value Date   HGBA1C 10.1 (H) 01/27/2024   VAS US  ABI WITH/WO TBI Result Date: 07/15/2024  LOWER EXTREMITY DOPPLER STUDY Patient Name:  Fred Green  Date of Exam:   07/15/2024 Medical Rec #: [id number]        Accession #:    [id number] Date of Birth: 03-03-1976         Patient Gender: M Patient Age:   2 years Exam Location:  [address] Procedure:       VAS US  ABI WITH/WO TBI  Referring Phys:  [facility] --------------------------------------------------------------------------------   Indications: Critical ischemia  Vascular Interventions: 01/26/2024 Right lower extremity thrombectomy.   Comparison Study: 02/04/24 Performing Technologist: Garnette Rockers  Examination Guidelines: A complete evaluation includes at minimum, Doppler waveform signals and systolic blood pressure reading at the level of bilateral brachial, anterior tibial, and posterior tibial arteries, when vessel segments are accessible.   Bilateral testing is considered an integral part of a complete examination. Photoelectric Plethysmograph (PPG) waveforms and toe systolic pressure readings are included as required and additional duplex testing as needed. Limited examinations for reoccurring indications may be performed as noted.    ABI Findings: +---------+------------------+-----+--------+--------+  Right    Rt Pressure (mmHg)IndexWaveformComment  +---------+------------------+-----+--------+--------+  Brachial 126                                     +---------+------------------+-----+--------+--------+  PTA      102               0.75 biphasic         +---------+------------------+-----+--------+--------+  DP       88                0.65 biphasic         +---------+------------------+-----+--------+--------+  Great Toe0                 0.00 Absent           +---------+------------------+-----+--------+--------+ +---------+------------------+-----+---------+-------+   Left      Lt Pressure (mmHg)IndexWaveform Comment +---------+------------------+-----+---------+-------+  Brachial 136                                     +---------+------------------+-----+---------+-------+  PTA      140               1.03 triphasic        +---------+------------------+-----+---------+-------+  DP       138               1.01 triphasic         +---------+------------------+-----+---------+-------+  Great Toe64                0.47 Normal           +---------+------------------+-----+---------+-------+ +-------+-----------+-----------+------------+------------+  ABI/TBIToday's ABIToday's TBIPrevious ABIPrevious TBI +-------+-----------+-----------+------------+------------+  Right  0.75       0          1.15        0.43         +-------+-----------+-----------+------------+------------+  Left   1.03       0.47       1.19        0            +-------+-----------+-----------+------------+------------+    Right ABIs and TBIs appear decreased compared to prior study on 02/04/24.  Left TBIs appear increased compared to prior study on 02/04/24.    Summary:  Right: Resting right ankle-brachial index indicates moderate right lower extremity arterial disease. The right toe-brachial index is abnormal.  Left: Resting left ankle-brachial index is within normal range. The left toe-brachial index is abnormal.   *See table(s) above for measurements and observations.    Electronically signed by Debby Robertson on 07/15/2024 at 12:40:05 PM.    Final    ADA Risk Categorization: High Risk  Patient has one or more of the following: Loss of protective sensation Absent pedal pulses Severe Foot deformity History of foot ulcer  Assessment: 1. Pain due to onychomycosis of toenail   2. Neuropathy   3. Hyperkeratosis of skin   4. Type 2 diabetes mellitus with diabetic peripheral angiopathy without gangrene, without long-term current use of insulin  (HCC)   5. Encounter for diabetic foot exam (HCC)     Plan: Diabetic foot examination performed today. All patient's and/or POA's questions/concerns addressed on today's visit. Toenails 1-5 debrided in length and girth without incident. Continue foot and shoe inspections daily. Monitor blood glucose per PCP/Endocrinologist's recommendations. Continue soft, supportive shoe gear daily.  Report any pedal injuries to medical professional. Call office if there are any questions/concerns. Discussed hyperkeratosis of soles of both feet. He is to apply Revitaderm Cream to rough skin once daily before bedtime. Apply cocoa butter or lotion to both feet once daily every morning. Use pumice  stone once weekly on rough skin after bath/shower. Avoid using sharp instrumentation on feet.. -Patient/POA to call should there be question/concern in the interim. Return in about 3 months (around 10/21/2024).  Delon LITTIE Merlin, DPM      [place] LOCATION: [address], [place] [address]                   Office [phone]   [facility] LOCATION: [address] [place], [place] [address] Office [phone]

## 2024-08-03 ENCOUNTER — Ambulatory Visit (HOSPITAL_COMMUNITY)
Admission: RE | Admit: 2024-08-03 | Discharge: 2024-08-03 | Disposition: A | Discharge status: Home / Self Care | Attending: Vascular Surgery | Admitting: Vascular Surgery

## 2024-08-03 ENCOUNTER — Encounter (HOSPITAL_COMMUNITY)
Admission: RE | Disposition: A | Discharge status: Home / Self Care | Payer: Self-pay | Source: Home / Self Care | Attending: Vascular Surgery

## 2024-08-03 ENCOUNTER — Other Ambulatory Visit: Payer: Self-pay

## 2024-08-03 DIAGNOSIS — E1140 Type 2 diabetes mellitus with diabetic neuropathy, unspecified: Secondary | ICD-10-CM | POA: Insufficient documentation

## 2024-08-03 DIAGNOSIS — Z7902 Long term (current) use of antithrombotics/antiplatelets: Secondary | ICD-10-CM | POA: Diagnosis not present

## 2024-08-03 DIAGNOSIS — E1151 Type 2 diabetes mellitus with diabetic peripheral angiopathy without gangrene: Secondary | ICD-10-CM | POA: Insufficient documentation

## 2024-08-03 DIAGNOSIS — Z7982 Long term (current) use of aspirin: Secondary | ICD-10-CM | POA: Insufficient documentation

## 2024-08-03 DIAGNOSIS — Z87891 Personal history of nicotine dependence: Secondary | ICD-10-CM | POA: Insufficient documentation

## 2024-08-03 DIAGNOSIS — Z95820 Peripheral vascular angioplasty status with implants and grafts: Secondary | ICD-10-CM

## 2024-08-03 DIAGNOSIS — Z79899 Other long term (current) drug therapy: Secondary | ICD-10-CM | POA: Insufficient documentation

## 2024-08-03 DIAGNOSIS — I70201 Unspecified atherosclerosis of native arteries of extremities, right leg: Secondary | ICD-10-CM

## 2024-08-03 DIAGNOSIS — Z7984 Long term (current) use of oral hypoglycemic drugs: Secondary | ICD-10-CM | POA: Insufficient documentation

## 2024-08-03 DIAGNOSIS — I70221 Atherosclerosis of native arteries of extremities with rest pain, right leg: Secondary | ICD-10-CM | POA: Diagnosis not present

## 2024-08-03 HISTORY — PX: LOWER EXTREMITY INTERVENTION: CATH118252

## 2024-08-03 HISTORY — PX: LOWER EXTREMITY ANGIOGRAPHY: CATH118251

## 2024-08-03 HISTORY — PX: ABDOMINAL AORTOGRAM: CATH118222

## 2024-08-03 LAB — GLUCOSE, CAPILLARY
Glucose-Capillary: 86 mg/dL (ref 70–99)
Glucose-Capillary: 72 mg/dL (ref 70–99)

## 2024-08-03 LAB — POCT I-STAT, CHEM 8
BUN: 20 mg/dL (ref 6–20)
Calcium, Ion: 1.27 mmol/L (ref 1.15–1.40)
Chloride: 102 mmol/L (ref 98–111)
Creatinine, Ser: 1.2 mg/dL (ref 0.61–1.24)
Glucose, Bld: 111 mg/dL — ABNORMAL HIGH (ref 70–99)
HCT: 39 % (ref 39.0–52.0)
Hemoglobin: 13.3 g/dL (ref 13.0–17.0)
Potassium: 4.2 mmol/L (ref 3.5–5.1)
Sodium: 139 mmol/L (ref 135–145)
TCO2: 27 mmol/L (ref 22–32)

## 2024-08-03 SURGERY — ABDOMINAL AORTOGRAM
Anesthesia: LOCAL

## 2024-08-03 MED ORDER — IODIXANOL 320 MG/ML IV SOLN
INTRAVENOUS | Status: DC | PRN
  Administered 2024-08-03 (×2): 75 mL

## 2024-08-03 MED ORDER — SODIUM CHLORIDE 0.9 % WEIGHT BASED INFUSION: 1.0000 mL/kg/h | INTRAVENOUS | Status: DC

## 2024-08-03 MED ORDER — SODIUM CHLORIDE 0.9% FLUSH: 3.0000 mL | Freq: Two times a day (BID) | INTRAVENOUS | Status: DC

## 2024-08-03 MED ORDER — LABETALOL HCL 5 MG/ML IV SOLN: 10.0000 mg | INTRAVENOUS | Status: DC | PRN

## 2024-08-03 MED ORDER — SODIUM CHLORIDE 0.9 % IV SOLN: 250.0000 mL | INTRAVENOUS | Status: DC | PRN

## 2024-08-03 MED ORDER — HYDRALAZINE HCL 20 MG/ML IJ SOLN: 5.0000 mg | INTRAMUSCULAR | Status: DC | PRN

## 2024-08-03 MED ORDER — ONDANSETRON HCL 4 MG/2ML IJ SOLN: 4.0000 mg | Freq: Four times a day (QID) | INTRAMUSCULAR | Status: DC | PRN

## 2024-08-03 MED ORDER — SODIUM CHLORIDE 0.9 % IV SOLN: INTRAVENOUS | Status: DC

## 2024-08-03 MED ORDER — CLOPIDOGREL BISULFATE 75 MG PO TABS
ORAL_TABLET | ORAL | Status: AC
  Filled 2024-08-03: qty 1

## 2024-08-03 MED ORDER — MIDAZOLAM HCL 2 MG/2ML IJ SOLN
INTRAMUSCULAR | Status: AC
  Filled 2024-08-03: qty 2

## 2024-08-03 MED ORDER — HEPARIN SODIUM (PORCINE) 1000 UNIT/ML IJ SOLN
INTRAMUSCULAR | Status: DC | PRN
Start: 2024-08-03 — End: 2024-08-03
  Administered 2024-08-03 (×2): 10000 [IU] via INTRAVENOUS

## 2024-08-03 MED ORDER — SODIUM CHLORIDE 0.9% FLUSH: 3.0000 mL | INTRAVENOUS | Status: DC | PRN

## 2024-08-03 MED ORDER — MIDAZOLAM HCL 2 MG/2ML IJ SOLN
INTRAMUSCULAR | Status: DC | PRN
  Administered 2024-08-03 (×2): 1 mg via INTRAVENOUS

## 2024-08-03 MED ORDER — ASPIRIN 81 MG PO CHEW
CHEWABLE_TABLET | ORAL | Status: DC | PRN
  Administered 2024-08-03 (×2): 81 mg via ORAL

## 2024-08-03 MED ORDER — ACETAMINOPHEN 325 MG PO TABS: 650.0000 mg | ORAL_TABLET | ORAL | Status: DC | PRN

## 2024-08-03 MED ORDER — LIDOCAINE HCL (PF) 1 % IJ SOLN
INTRAMUSCULAR | Status: AC
  Filled 2024-08-03: qty 30

## 2024-08-03 MED ORDER — HEPARIN (PORCINE) IN NACL 2000-0.9 UNIT/L-% IV SOLN
INTRAVENOUS | Status: DC | PRN
  Administered 2024-08-03 (×2): 1000 mL

## 2024-08-03 MED ORDER — CLOPIDOGREL BISULFATE 75 MG PO TABS
ORAL_TABLET | ORAL | Status: DC | PRN
Start: 2024-08-03 — End: 2024-08-03
  Administered 2024-08-03 (×2): 75 mg via ORAL

## 2024-08-03 MED ORDER — ASPIRIN 81 MG PO CHEW
CHEWABLE_TABLET | ORAL | Status: AC
Start: 2024-08-03 — End: 2024-08-03
  Filled 2024-08-03: qty 1

## 2024-08-03 MED ORDER — FENTANYL CITRATE (PF) 100 MCG/2ML IJ SOLN
INTRAMUSCULAR | Status: AC
Start: 2024-08-03 — End: 2024-08-03
  Filled 2024-08-03: qty 2

## 2024-08-03 MED ORDER — LIDOCAINE HCL (PF) 1 % IJ SOLN
INTRAMUSCULAR | Status: DC | PRN
  Administered 2024-08-03 (×2): 15 mL

## 2024-08-03 MED ORDER — FENTANYL CITRATE (PF) 100 MCG/2ML IJ SOLN
INTRAMUSCULAR | Status: DC | PRN
  Administered 2024-08-03 (×2): 50 ug via INTRAVENOUS

## 2024-08-03 SURGICAL SUPPLY — 14 items
BALLOON STRLNG SL OTW 3X80X150 (BALLOONS) IMPLANT
CATH AURYON ATHREC 1.7 (CATHETERS) IMPLANT
CATH OMNI FLUSH 5F 65CM (CATHETERS) IMPLANT
CATH QUICKCROSS .018X135CM (MICROCATHETER) IMPLANT
CLOSURE MYNX CONTROL 6F/7F (Vascular Products) IMPLANT
KIT MICROPUNCTURE NIT STIFF (SHEATH) IMPLANT
KIT SINGLE USE MANIFOLD (KITS) IMPLANT
PACK CARDIAC CATHETERIZATION (CUSTOM PROCEDURE TRAY) IMPLANT
SET ATX-X65L (MISCELLANEOUS) IMPLANT
SHEATH CATAPULT 6F 45 MP (SHEATH) IMPLANT
SHEATH PINNACLE 5F 10CM (SHEATH) IMPLANT
SHEATH PINNACLE 6F 10CM (SHEATH) IMPLANT
WIRE BENTSON .035X145CM (WIRE) IMPLANT
WIRE G V18X300CM (WIRE) IMPLANT

## 2024-08-03 NOTE — Progress Notes (Signed)
 Discharge instructions reviewed with patient and his significant other, denies questions or concerns. PT ambulated on the unit with nurse to bathroom where he was able to void without difficulty. Incision site remains C/D/I, No S/S of complications. PT escorted off the unit via staff, to personal vehicle.

## 2024-08-03 NOTE — Op Note (Signed)
 Patient name: Fred Green MRN: [id number] DOB: 04-11-1976 Sex: male  08/03/2024 Pre-operative Diagnosis: Restenosis right tibioperoneal trunk patch angioplasty Post-operative diagnosis:  Same Surgeon:  Penne C. Sheree, MD Procedure Performed: 1.  Percutaneous ultrasound-guided cannulation and Mynx device closure of the left common femoral artery 2.  Catheter selection of aorta and aortogram with bilateral lower extremity angiography 3.  Catheter selection right posterior tibial artery 4.  Laser atherectomy of the right tibioperoneal trunk with 1.7 mm [place] 5.  Plain balloon angioplasty right tibioperoneal trunk with 3 mm balloon 6.  Moderate sedation with fentanyl  and Versed  for 42 minutes  Indications: 48 year old male with a history of right lower extremity thrombectomy with right below the knee popliteal artery exposure with the TP trunk endarterectomy and bovine pericardial patch angioplasty with right SFA and popliteal artery stents.  He now has evidence of restenosis of the left tibioperoneal trunk endarterectomy site and is indicated for angiography with possible intervention.  Findings: Aorta  and iliac artery segments were all patent bilateral renal arteries filled.  Left common femoral artery and profunda and SFA were patent.  Left hypogastric artery had approximately 50% stenosis.  Right hypogastric arteries patent.  Right common femoral, profunda, SFA all patent.  There is a distal SFA stent leading into the popliteal artery which goes across the which is patent without evidence of stenosis.  There is 75% stenosis of  the tibioperoneal trunk.  The anterior tibial artery is initially patent and then occludes for a long segment and reconstitutes via the peroneal artery at the foot.  The peroneal artery is the dominant runoff which gives rise to the anterior tibial artery near the foot.  Posterior tibial artery is diminutive but patent all the way to the ankle and then gives rise to  collaterals.  After treatment of the tibioperoneal trunk stenosis there is no residual stenosis and brisk flow via the peroneal artery which has a very strong signal at the ankle.    Procedure:  The patient was identified in the holding area and taken to room 8.  The patient was then placed supine on the table and prepped and draped in the usual sterile fashion.  A time out was called.  Ultrasound was used to evaluate the left common femoral artery which was patent.  The area was anesthetized 1% lidocaine  and cannulated with micropuncture followed by wire and sheath.  Ultrasound images saved the permanent record.  Concomitantly we administered fentanyl  and Versed  as moderate sedation and his vital signs were monitored throughout the case.  We then placed a Bentson wire 5 French sheath and an Omni catheter was placed at the level of L1 and aortogram was performed followed by pelvic angiography.  We then crossed the bifurcation with the Omni catheter Bentson wire perform right lower extremity angiography with the above findings.  We then placed a long 6 French sheath in the right SFA and the patient was fully heparinized.  An 018 V18 wire was used to cross the stents in the SFA popliteal artery on the right and then a catheter was used to direct across the stenosis of the tibioperoneal trunk and into the posterior tibial artery and we confirmed intraluminal access.  We then performed laser atherectomy at high and low power at the tibioperoneal trunk with a 1.7 mm laser.  At completion we then performed balloon angioplasty of this demonstrated no residual stenosis.  Satisfied with this we removed the wire.  We performed retrograde left common  femoral angiography and exchanged for a short 6 French sheath and then deployed a minx device.  He tolerated the procedure without any complication.  Contrast: 75cc  Kaula Klenke C. Sheree, MD [facility] Office: [phone] Pager:  [phone]

## 2024-08-03 NOTE — Discharge Instructions (Signed)
 Femoral Site Care This sheet gives you information about how to care for yourself after your procedure. Your health care provider may also give you more specific instructions. If you have problems or questions, contact your health care provider. What can I expect after the procedure?  After the procedure, it is common to have: Bruising that usually fades within 1-2 weeks. Tenderness at the site. Follow these instructions at home: Wound care Follow instructions from your health care provider about how to take care of your insertion site. Make sure you: Wash your hands with soap and water before you change your bandage (dressing). If soap and water are not available, use hand sanitizer. Remove your dressing as told by your health care provider. In 24 hours Do not take baths, swim, or use a hot tub until your health care provider approves. You may shower 24-48 hours after the procedure or as told by your health care provider. Gently wash the site with plain soap and water. Pat the area dry with a clean towel. Do not rub the site. This may cause bleeding. Do not apply powder or lotion to the site. Keep the site clean and dry. Check your femoral site every day for signs of infection. Check for: Redness, swelling, or pain. Fluid or blood. Warmth. Pus or a bad smell. Activity For the first 2-3 days after your procedure, or as long as directed: Avoid climbing stairs as much as possible. Do not squat. Do not lift anything that is heavier than 10 lb (4.5 kg), or the limit that you are told, until your health care provider says that it is safe. For 7 days Rest as directed. Avoid sitting for a long time without moving. Get up to take short walks every 1-2 hours. Do not drive for 24 hours if you were given a medicine to help you relax (sedative). General instructions Take over-the-counter and prescription medicines only as told by your health care provider. Keep all follow-up visits as told by  your health care provider. This is important. Contact a health care provider if you have: A fever or chills. You have redness, swelling, or pain around your insertion site. Get help right away if: The catheter insertion area swells very fast. You pass out. You suddenly start to sweat or your skin gets clammy. The catheter insertion area is bleeding, and the bleeding does not stop when you hold steady pressure on the area. The area near or just beyond the catheter insertion site becomes pale, cool, tingly, or numb. These symptoms may represent a serious problem that is an emergency. Do not wait to see if the symptoms will go away. Get medical help right away. Call your local emergency services (911 in the [place] Do not drive yourself to the hospital. Summary After the procedure, it is common to have bruising that usually fades within 1-2 weeks. Check your femoral site every day for signs of infection. Do not lift anything that is heavier than 10 lb (4.5 kg), or the limit that you are told, until your health care provider says that it is safe. This information is not intended to replace advice given to you by your health care provider. Make sure you discuss any questions you have with your health care provider. Document Revised: 12/23/2017 Document Reviewed: 12/23/2017 Elsevier Patient Education  2020 [employer].

## 2024-08-03 NOTE — Interval H&P Note (Signed)
 History and Physical Interval Note:  08/03/2024 9:38 AM  Fred Green  has presented today for surgery, with the diagnosis of atherosclerosis right lower with rest pain.  The various methods of treatment have been discussed with the patient and family. After consideration of risks, benefits and other options for treatment, the patient has consented to  Procedure(s): ABDOMINAL AORTOGRAM (N/A) Lower Extremity Angiography (N/A) LOWER EXTREMITY INTERVENTION (N/A) as a surgical intervention.  The patient's history has been reviewed, patient examined, no change in status, stable for surgery.  I have reviewed the patient's chart and labs.  Questions were answered to the patient's satisfaction.     Penne [place]

## 2024-08-04 ENCOUNTER — Encounter (HOSPITAL_COMMUNITY): Payer: Self-pay | Admitting: Vascular Surgery

## 2024-08-13 ENCOUNTER — Other Ambulatory Visit: Payer: Self-pay | Admitting: Vascular Surgery

## 2024-08-13 DIAGNOSIS — I70221 Atherosclerosis of native arteries of extremities with rest pain, right leg: Secondary | ICD-10-CM

## 2024-09-16 ENCOUNTER — Ambulatory Visit

## 2024-09-16 ENCOUNTER — Ambulatory Visit (HOSPITAL_COMMUNITY)

## 2024-09-23 ENCOUNTER — Ambulatory Visit (INDEPENDENT_AMBULATORY_CARE_PROVIDER_SITE_OTHER): Admitting: Physician Assistant

## 2024-09-23 ENCOUNTER — Ambulatory Visit (HOSPITAL_COMMUNITY)
Admission: RE | Admit: 2024-09-23 | Discharge: 2024-09-23 | Disposition: A | Discharge status: Home / Self Care | Source: Ambulatory Visit | Attending: Vascular Surgery | Admitting: Vascular Surgery

## 2024-09-23 ENCOUNTER — Ambulatory Visit (HOSPITAL_COMMUNITY): Admission: RE | Admit: 2024-09-23 | Discharge: 2024-09-23 | Attending: Vascular Surgery

## 2024-09-23 VITALS — BP 105/73 | HR 90 | Temp 98.0°F | Resp 18 | Ht 76.0 in | Wt 298.5 lb

## 2024-09-23 DIAGNOSIS — I70221 Atherosclerosis of native arteries of extremities with rest pain, right leg: Secondary | ICD-10-CM | POA: Insufficient documentation

## 2024-09-23 DIAGNOSIS — I739 Peripheral vascular disease, unspecified: Secondary | ICD-10-CM | POA: Diagnosis present

## 2024-09-23 DIAGNOSIS — Z9889 Other specified postprocedural states: Secondary | ICD-10-CM | POA: Diagnosis present

## 2024-09-23 LAB — VAS US ABI WITH/WO TBI
Left ABI: 1.09
Right ABI: 0.98

## 2024-09-23 NOTE — Progress Notes (Signed)
 Office Note     CC:  follow up Requesting Provider:  Sebastian Jerilyn HERO, *  HPI: Fred Green is a 48 y.o. (10-31-1976) male who presents status post angiogram with laser atherectomy of the right TP trunk and balloon angioplasty of the TP trunk with Dr. 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  Surgical history is significant for right lower extremity thrombectomy with right popliteal and TP trunk endarterectomy with bovine patch angioplasty as well as stenting of the SFA and popliteal artery and 4 compartment fasciotomies on 01/26/2024.  He subsequently underwent excisional debridement of his fasciotomies on 02/20/2024.  His fasciotomy incisions have since healed.  He is having a difficult time with swelling of the right leg.  He tries to wear compression and elevate is much as possible.  He is on aspirin , Plavix , statin daily.   Past Medical History:  Diagnosis Date   Diabetes mellitus without complication (HCC)    GERD (gastroesophageal reflux disease)    Gout    Peripheral arterial disease    Peripheral vascular disease    Tobacco abuse     Past Surgical History:  Procedure Laterality Date   ABDOMINAL AORTOGRAM N/A 08/03/2024   Procedure: ABDOMINAL AORTOGRAM;  Surgeon: Sheree Penne Bruckner, MD;  Location: [facility] INVASIVE CV LAB;  Service: Cardiovascular;  Laterality: N/A;   APPLICATION OF WOUND VAC Right 02/20/2024   Procedure: APPLICATION OF WOUND VAC;  Surgeon: Sheree Penne Bruckner, MD;  Location: [facility] OR;  Service: Vascular;  Laterality: Right;   FASCIOTOMY Right 01/26/2024   Procedure: RIGHT LOWER LEG FOUR COMPARTMENT FASCIOTOMY;  Surgeon: Sheree Penne Bruckner, MD;  Location: [facility] OR;  Service: Vascular;  Laterality: Right;   INCISION AND DRAINAGE OF WOUND Right 02/20/2024   Procedure: IRRIGATION AND DEBRIDEMENT WOUND RIGHT LEG;  Surgeon: Sheree Penne Bruckner, MD;  Location: [facility] OR;  Service: Vascular;  Laterality: Right;   INSERTION  OF ILIAC STENT Right 01/26/2024   Procedure: INSERTION OF RIGHT POPITEAL ARTERY STENT;  Surgeon: Sheree Penne Bruckner, MD;  Location: [facility] OR;  Service: Vascular;  Laterality: Right;   LOWER EXTREMITY ANGIOGRAM Right 01/26/2024   Procedure: RIGHT LOWER EXTREMITY ANGIOGRAM;  Surgeon: Sheree Penne Bruckner, MD;  Location: [facility] OR;  Service: Vascular;  Laterality: Right;   LOWER EXTREMITY ANGIOGRAPHY N/A 08/03/2024   Procedure: Lower Extremity Angiography;  Surgeon: Sheree Penne Bruckner, MD;  Location: [facility] INVASIVE CV LAB;  Service: Cardiovascular;  Laterality: N/A;   LOWER EXTREMITY INTERVENTION N/A 08/03/2024   Procedure: LOWER EXTREMITY INTERVENTION;  Surgeon: Sheree Penne Bruckner, MD;  Location: [facility] INVASIVE CV LAB;  Service: Cardiovascular;  Laterality: N/A;   NO PAST SURGERIES     PATCH ANGIOPLASTY Right 01/26/2024   Procedure: PATCH ANGIOPLASTY USING GEORGE LISLE;  Surgeon: Sheree Penne Bruckner, MD;  Location: [facility] OR;  Service: Vascular;  Laterality: Right;   THROMBECTOMY OF BYPASS GRAFT FEMORAL- POPLITEAL ARTERY Right 01/26/2024   Procedure: RIGHT LOWER EXTREMITY THROMBECTOMY;  Surgeon: Sheree Penne Bruckner, MD;  Location: [facility] OR;  Service: Vascular;  Laterality: Right;    Social History   Socioeconomic History   Marital status: Significant Other    Spouse name: Not on file   Number of children: Not on file   Years of education: Not on file   Highest education level: Not on file  Occupational History   Not on file  Tobacco Use   Smoking status: Former    Current packs/day: 0.00    Types: Cigarettes  Quit date: 01/25/2024    Years since quitting: 0.6   Smokeless tobacco: Not on file  Vaping Use   Vaping status: Never Used  Substance and Sexual Activity   Alcohol use: No   Drug use: Never   Sexual activity: Yes  Other Topics Concern   Not on file  Social History Narrative   Not on file   Social Drivers of Health   Financial Resource Strain: Not on file   Food Insecurity: Food Insecurity Present (01/27/2024)   Hunger Vital Sign    Worried About Running Out of Food in the Last Year: Sometimes true    Ran Out of Food in the Last Year: Not on file  Transportation Needs: Not on file  Physical Activity: Not on file  Stress: Not on file  Social Connections: Not on file  Intimate Partner Violence: Not on file    Family History  Problem Relation Age of Onset   Diabetes Mother    Stroke Mother    Cancer Mother    Heart failure Father    Heart attack Father     Current Outpatient Medications  Medication Sig Dispense Refill   aspirin  EC 81 MG tablet Take 1 tablet (81 mg total) by mouth daily. Swallow whole. 30 tablet 12   atorvastatin  (LIPITOR) 20 MG tablet Take 20 mg by mouth daily.     clopidogrel  (PLAVIX ) 75 MG tablet Take 1 tablet (75 mg total) by mouth daily at 6 (six) AM. 30 tablet 11   folic acid (FOLVITE) 1 MG tablet Take 1 mg by mouth daily.     gabapentin  (NEURONTIN ) 400 MG capsule Take 400 mg by mouth at bedtime.     hydrochlorothiazide (HYDRODIURIL) 25 MG tablet Take 25 mg by mouth daily.     metFORMIN  (GLUCOPHAGE ) 500 MG tablet Take 1 tablet (500 mg total) by mouth 2 (two) times daily with a meal. 60 tablet 3   metoprolol  succinate (TOPROL  XL) 25 MG 24 hr tablet Take 0.5 tablets (12.5 mg total) by mouth at bedtime. 90 tablet 3   sertraline (ZOLOFT) 50 MG tablet Take 50 mg by mouth daily.     No current facility-administered medications for this visit.    No Known Allergies   REVIEW OF SYSTEMS:  Negative unless noted in HPI [X]  denotes positive finding, [ ]  denotes negative finding Cardiac  Comments:  Chest pain or chest pressure:    Shortness of breath upon exertion:    Short of breath when lying flat:    Irregular heart rhythm:        Vascular    Pain in calf, thigh, or hip brought on by ambulation:    Pain in feet at night that wakes you up from your sleep:     Blood clot in your veins:    Leg swelling:          Pulmonary    Oxygen at home:    Productive cough:     Wheezing:         Neurologic    Sudden weakness in arms or legs:     Sudden numbness in arms or legs:     Sudden onset of difficulty speaking or slurred speech:    Temporary loss of vision in one eye:     Problems with dizziness:         Gastrointestinal    Blood in stool:     Vomited blood:         Genitourinary  Burning when urinating:     Blood in urine:        Psychiatric    Major depression:         Hematologic    Bleeding problems:    Problems with blood clotting too easily:        Skin    Rashes or ulcers:        Constitutional    Fever or chills:      PHYSICAL EXAMINATION:  Vitals:   09/23/24 1252  BP: 105/73  Pulse: 90  Resp: 18  Temp: 98 F (36.7 C)  TempSrc: Temporal  SpO2: 97%  Weight: 298 lb 8 oz (135.4 kg)  Height: 6' 4 (1.93 m)    General:  WDWN in NAD; vital signs documented above Gait: Not observed HENT: WNL, normocephalic Pulmonary: normal non-labored breathing Cardiac: regular HR Abdomen: soft, NT, no masses Skin: without rashes Vascular Exam/Pulses: brisk R peroneal and DP by doppler Extremities: without ischemic changes, without Gangrene , without cellulitis; without open wounds;  Musculoskeletal: no muscle wasting or atrophy  Neurologic: A&O X 3 Psychiatric:  The pt has Normal affect.   Non-Invasive Vascular Imaging:   Right SFA to TP trunk stent widely patent  ABI/TBIToday's ABIToday's TBIPrevious ABIPrevious TBI  +-------+-----------+-----------+------------+------------+  Right 0.98       0.47       0.75        0             +-------+-----------+-----------+------------+------------+  Left  1.09       0.61       1.03        0.47          +-------+-----------+-----------+------------+------------+      ASSESSMENT/PLAN:: 48 y.o. male status post atherectomy with balloon angioplasty of the TP trunk due to restenosis  Mr. Rosamond is doing better  since surgery.  He still has trouble with edema and neuropathic pain however overall is feeling better.  Right leg is well-perfused on exam.  Duplex demonstrates widely patent SFA to TP trunk stenting.  His ABI and TBI have drastically improved.  Encouraged him to continue regular use of the compression socks and to elevate legs during the day.  He will continue his aspirin , Plavix , statin daily.  We will repeat imaging studies in 6 months.  He knows to call/return office sooner with any questions or concerns.   Donnice Sender, PA-C [facility] [phone]  Clinic MD:   Sheree

## 2024-09-24 ENCOUNTER — Other Ambulatory Visit: Payer: Self-pay | Admitting: *Deleted

## 2024-09-24 DIAGNOSIS — I70221 Atherosclerosis of native arteries of extremities with rest pain, right leg: Secondary | ICD-10-CM

## 2024-09-24 DIAGNOSIS — I739 Peripheral vascular disease, unspecified: Secondary | ICD-10-CM

## 2024-11-03 ENCOUNTER — Encounter: Payer: Self-pay | Admitting: Podiatry

## 2024-11-03 ENCOUNTER — Ambulatory Visit: Admitting: Podiatry

## 2024-11-03 DIAGNOSIS — E1151 Type 2 diabetes mellitus with diabetic peripheral angiopathy without gangrene: Secondary | ICD-10-CM

## 2024-11-03 DIAGNOSIS — M79676 Pain in unspecified toe(s): Secondary | ICD-10-CM

## 2024-11-03 DIAGNOSIS — B351 Tinea unguium: Secondary | ICD-10-CM

## 2024-11-03 MED ORDER — CLOTRIMAZOLE-BETAMETHASONE 1-0.05 % EX CREA
1.0000 | TOPICAL_CREAM | Freq: Every day | CUTANEOUS | 0 refills | Status: AC
Start: 2024-11-03 — End: ?

## 2024-11-10 ENCOUNTER — Ambulatory Visit (INDEPENDENT_AMBULATORY_CARE_PROVIDER_SITE_OTHER): Admitting: Podiatry

## 2024-11-10 VITALS — Ht 76.0 in | Wt 298.5 lb

## 2024-11-10 DIAGNOSIS — B353 Tinea pedis: Secondary | ICD-10-CM | POA: Diagnosis not present

## 2024-11-10 DIAGNOSIS — B351 Tinea unguium: Secondary | ICD-10-CM

## 2024-11-10 MED ORDER — TERBINAFINE HCL 250 MG PO TABS: 250.0000 mg | ORAL_TABLET | Freq: Every day | ORAL | 0 refills | Status: AC

## 2024-11-10 MED ORDER — KETOCONAZOLE 2 % EX CREA: 1.0000 | TOPICAL_CREAM | Freq: Every day | CUTANEOUS | 2 refills | Status: AC

## 2024-11-12 NOTE — Progress Notes (Signed)
  Subjective:  Patient ID: Janice JONELLE Moats, male    DOB: 1976-12-02,  MRN: [id number]  Chief Complaint  Patient presents with   Nail Problem    RM 2 Patient is here for bilatreal onychomycosis. Patient is requesting antifungal medication.    48 y.o. male presents with the above complaint. History confirmed with patient.   Objective:  Physical Exam: warm, good capillary refill, no trophic changes or ulcerative lesions, normal DP and PT pulses, normal sensory exam, onychomycosis, and tinea pedis.      Assessment:   1. Onychomycosis   2. Tinea pedis of both feet      Plan:  Patient was evaluated and treated and all questions answered.   Discussed the etiology and treatment options for tinea pedis.  Discussed topical and oral treatment.  Recommended topical treatment with 2% ketoconazole cream.  This was sent to the patient's pharmacy.  Also discussed appropriate foot hygiene, use of antifungal spray such as Tinactin in shoes, as well as cleaning her foot surfaces such as showers and bathroom floors with bleach.  Onychomycosis -Educated on etiology of nail fungus. -Discussed oral topical and laser therapy and risks and benefits of each -eRx for oral terbinafine #90. Educated on risks and benefits of the medication. -Photographs taken  Return in about 4 months (around 03/10/2025) for follow up after nail fungus treatment.

## 2024-11-13 ENCOUNTER — Encounter: Payer: Self-pay | Admitting: Podiatry

## 2024-11-13 NOTE — Progress Notes (Signed)
 Subjective:  Patient ID: Fred Green, male    DOB: 05/04/1976,  MRN: [id number]  Fred Green presents to clinic today for at risk foot care. Pt has h/o NIDDM with PAD and painful mycotic toenails of both feet that are difficult to trim. Pain interferes with daily activities and wearing enclosed shoe gear comfortably. Patient with recent vascular intervention by Vascular Surgery. Patient would like to discuss treatment for onychomycosis of toenails. Chief Complaint  Patient presents with   Nail Problem    Thick painful toenails, 3 month follow up    Diabetes    A1C 10.1   New problem(s): None.   PCP is Sebastian Jerilyn HERO, FNP.  Past Surgical History:  Procedure Laterality Date   ABDOMINAL AORTOGRAM N/A 08/03/2024   Procedure: ABDOMINAL AORTOGRAM;  Surgeon: Sheree Penne Bruckner, MD;  Location: [facility] INVASIVE CV LAB;  Service: Cardiovascular;  Laterality: N/A;   APPLICATION OF WOUND VAC Right 02/20/2024   Procedure: APPLICATION OF WOUND VAC;  Surgeon: Sheree Penne Bruckner, MD;  Location: [facility] OR;  Service: Vascular;  Laterality: Right;   FASCIOTOMY Right 01/26/2024   Procedure: RIGHT LOWER LEG FOUR COMPARTMENT FASCIOTOMY;  Surgeon: Sheree Penne Bruckner, MD;  Location: [facility] OR;  Service: Vascular;  Laterality: Right;   INCISION AND DRAINAGE OF WOUND Right 02/20/2024   Procedure: IRRIGATION AND DEBRIDEMENT WOUND RIGHT LEG;  Surgeon: Sheree Penne Bruckner, MD;  Location: [facility] OR;  Service: Vascular;  Laterality: Right;   INSERTION OF ILIAC STENT Right 01/26/2024   Procedure: INSERTION OF RIGHT POPITEAL ARTERY STENT;  Surgeon: Sheree Penne Bruckner, MD;  Location: [facility] OR;  Service: Vascular;  Laterality: Right;   LOWER EXTREMITY ANGIOGRAM Right 01/26/2024   Procedure: RIGHT LOWER EXTREMITY ANGIOGRAM;  Surgeon: Sheree Penne Bruckner, MD;  Location: [facility] OR;  Service: Vascular;  Laterality: Right;   LOWER EXTREMITY ANGIOGRAPHY N/A 08/03/2024   Procedure: Lower Extremity  Angiography;  Surgeon: Sheree Penne Bruckner, MD;  Location: [facility] INVASIVE CV LAB;  Service: Cardiovascular;  Laterality: N/A;   LOWER EXTREMITY INTERVENTION N/A 08/03/2024   Procedure: LOWER EXTREMITY INTERVENTION;  Surgeon: Sheree Penne Bruckner, MD;  Location: [facility] INVASIVE CV LAB;  Service: Cardiovascular;  Laterality: N/A;   NO PAST SURGERIES     PATCH ANGIOPLASTY Right 01/26/2024   Procedure: PATCH ANGIOPLASTY USING GEORGE LISLE;  Surgeon: Sheree Penne Bruckner, MD;  Location: [facility] OR;  Service: Vascular;  Laterality: Right;   THROMBECTOMY OF BYPASS GRAFT FEMORAL- POPLITEAL ARTERY Right 01/26/2024   Procedure: RIGHT LOWER EXTREMITY THROMBECTOMY;  Surgeon: Sheree Penne Bruckner, MD;  Location: [facility] OR;  Service: Vascular;  Laterality: Right;    No Known Allergies  Review of Systems: Negative except as noted in the HPI.  Objective: No changes noted in today's physical examination. There were no vitals filed for this visit. Fred Green is a pleasant 48 y.o. male in NAD. AAO x 3.  Vascular Examination: Capillary refill time immediate b/l. Vascular status intact b/l with diminished pedal pulses b/l.  Pedal hair absent b/l. No pain with calf compression b/l. Skin temperature gradient WNL b/l. No cyanosis or clubbing b/l. No ischemia or gangrene noted b/l.   Neurological Examination: Sensation grossly intact b/l with 10 gram monofilament. Vibratory sensation intact b/l. Pt has subjective symptoms of neuropathy.  Dermatological Examination:  No open wounds. No interdigital macerations.   Toenails 1-5 b/l thick, discolored, elongated with subungual debris and pain on dorsal palpation. Diffuse scaling noted peripherally and plantarly b/l feet.  No interdigital macerations.  No blisters, no weeping. No signs of secondary bacterial infection noted.   Musculoskeletal Examination: Muscle strength 5/5 to all lower extremity muscle groups bilaterally. No pain, crepitus or joint  limitation noted with ROM bilateral LE.  Radiographs: None       Assessment/Plan: 1. Pain due to onychomycosis of toenail   2. Type II diabetes mellitus with peripheral circulatory disorder (HCC)     Meds ordered this encounter  Medications   clotrimazole -betamethasone  (LOTRISONE ) cream    Sig: Apply 1 Application topically daily. Apply to scaly skin on both feet twice daily for 6 weeks.    Dispense:  45 g    Refill:  0  -Patient was evaluated today. All questions/concerns addressed on today's visit. -Examined patient. -Mycotic toenails 1-5 bilaterally were debrided in length and girth with sterile nail nippers and dremel without incident. -For tinea pedis, prescription written for Lotrisone  Cream 1%/0.05% to be applied to affected foot/feet twice daily for 6 weeks. -Patient referred to Dr. Juliene Medicine for evaluation of mycotic toenails to discuss oral therapy. -Patient/POA to call should there be question/concern in the interim.   Return in about 3 months (around 02/03/2025).  Delon LITTIE Merlin, DPM      [place] LOCATION: [address], [place] [address]                   Office [phone]   [facility] LOCATION: [address] [place], [place] [address] Office [phone]

## 2024-11-26 ENCOUNTER — Other Ambulatory Visit: Payer: Self-pay

## 2024-11-26 ENCOUNTER — Ambulatory Visit

## 2024-11-26 DIAGNOSIS — G8929 Other chronic pain: Secondary | ICD-10-CM | POA: Insufficient documentation

## 2024-11-26 DIAGNOSIS — M25511 Pain in right shoulder: Secondary | ICD-10-CM | POA: Insufficient documentation

## 2024-11-26 NOTE — Therapy (Addendum)
 OUTPATIENT PHYSICAL THERAPY SHOULDER EVALUATION   Patient Name: Fred Green MRN: [id number] DOB:[birth year], 48 y.o., male Today's Date: 11/26/2024  END OF SESSION:   Past Medical History:  Diagnosis Date   Diabetes mellitus without complication (HCC)    GERD (gastroesophageal reflux disease)    Gout    Peripheral arterial disease    Peripheral vascular disease    Tobacco abuse    Past Surgical History:  Procedure Laterality Date   ABDOMINAL AORTOGRAM N/A 08/03/2024   Procedure: ABDOMINAL AORTOGRAM;  Surgeon: Sheree Penne Bruckner, MD;  Location: [facility] INVASIVE CV LAB;  Service: Cardiovascular;  Laterality: N/A;   APPLICATION OF WOUND VAC Right 02/20/2024   Procedure: APPLICATION OF WOUND VAC;  Surgeon: Sheree Penne Bruckner, MD;  Location: [facility] OR;  Service: Vascular;  Laterality: Right;   FASCIOTOMY Right 01/26/2024   Procedure: RIGHT LOWER LEG FOUR COMPARTMENT FASCIOTOMY;  Surgeon: Sheree Penne Bruckner, MD;  Location: [facility] OR;  Service: Vascular;  Laterality: Right;   INCISION AND DRAINAGE OF WOUND Right 02/20/2024   Procedure: IRRIGATION AND DEBRIDEMENT WOUND RIGHT LEG;  Surgeon: Sheree Penne Bruckner, MD;  Location: [facility] OR;  Service: Vascular;  Laterality: Right;   INSERTION OF ILIAC STENT Right 01/26/2024   Procedure: INSERTION OF RIGHT POPITEAL ARTERY STENT;  Surgeon: Sheree Penne Bruckner, MD;  Location: [facility] OR;  Service: Vascular;  Laterality: Right;   LOWER EXTREMITY ANGIOGRAM Right 01/26/2024   Procedure: RIGHT LOWER EXTREMITY ANGIOGRAM;  Surgeon: Sheree Penne Bruckner, MD;  Location: [facility] OR;  Service: Vascular;  Laterality: Right;   LOWER EXTREMITY ANGIOGRAPHY N/A 08/03/2024   Procedure: Lower Extremity Angiography;  Surgeon: Sheree Penne Bruckner, MD;  Location: [facility] INVASIVE CV LAB;  Service: Cardiovascular;  Laterality: N/A;   LOWER EXTREMITY INTERVENTION N/A 08/03/2024   Procedure: LOWER EXTREMITY INTERVENTION;  Surgeon: Sheree Penne Bruckner, MD;   Location: [facility] INVASIVE CV LAB;  Service: Cardiovascular;  Laterality: N/A;   NO PAST SURGERIES     PATCH ANGIOPLASTY Right 01/26/2024   Procedure: PATCH ANGIOPLASTY USING GEORGE LISLE;  Surgeon: Sheree Penne Bruckner, MD;  Location: [facility] OR;  Service: Vascular;  Laterality: Right;   THROMBECTOMY OF BYPASS GRAFT FEMORAL- POPLITEAL ARTERY Right 01/26/2024   Procedure: RIGHT LOWER EXTREMITY THROMBECTOMY;  Surgeon: Sheree Penne Bruckner, MD;  Location: [facility] OR;  Service: Vascular;  Laterality: Right;   Patient Active Problem List   Diagnosis Date Noted   Diabetes [facility])    PAD (peripheral artery disease) 02/20/2024   Vascular complication 01/26/2024   Critical limb ischemia of right lower extremity (HCC) 01/26/2024   Back pain 01/11/2016   Tobacco abuse 01/11/2016   Abnormal EKG 01/11/2016   GERD (gastroesophageal reflux disease)    Gastroesophageal reflux disease without esophagitis     PCP: Sebastian Jerilyn HERO, FNP  REFERRING PROVIDER: Beverley Evalene BIRCH, MD  REFERRING DIAG: pain in right shoulder   THERAPY DIAG:  Chronic right shoulder pain  Rationale for Evaluation and Treatment: Rehabilitation  ONSET DATE: 11/10/2024 date of referral  SUBJECTIVE:  SUBJECTIVE STATEMENT: Patient reports that he has R shoulder pain that began with a pinched nerve feeling. Patient states that it takes about 30 minutes to subside when it flares up. He states that he cannot recall an injury, but he worked for years doing gift wrapping and packaging, which required a lot of lifting. Those rolls could go back to 80 pounds   Hand dominance: Right  PERTINENT HISTORY: Relevant PMHx includes DM, Gout, PAD, PVD, tobacco use  PAIN:  Are you having pain?  Yes: NPRS scale: 5/10 current  Pain location: anterior shoulder  primarily  Pain description: pulsating Aggravating factors: rolling over on R side, lift arm  Relieving factors: steroid injection (helped for first 5-6 hours)  PRECAUTIONS: None  RED FLAGS: None   WEIGHT BEARING RESTRICTIONS: No  FALLS:  Has patient fallen in last 6 months? No  LIVING ENVIRONMENT: Lives with: lives with their family Lives in: House/apartment Has following equipment at home: None  OCCUPATION: N/a  PLOF: Independent  PATIENT GOALS:to have less shoulder pain with lifting his arm, laying on his R side  NEXT MD VISIT: none scheduled with referring provider  OBJECTIVE:  Note: Objective measures were completed at Evaluation unless otherwise noted.  DIAGNOSTIC FINDINGS:  X-ray of the right shoulder was performed today, showing no evidence of arthritis or stress fracture. Independent interpretation performed by me indicates that further imaging, such as an MRI, may be necessary if symptoms do not improve with initial treatment.   PATIENT SURVEYS:  Quick Dash:  QUICK DASH  Please rate your ability do the following activities in the last week by selecting the number below the appropriate response.   Activities Rating  Open a tight or new jar.  1 = No difficulty   Do heavy household chores (e.g., wash walls, floors). 3 = Moderate difficulty  Carry a shopping bag or briefcase 1 = No difficulty   Wash your back. 5 = Unable  Use a knife to cut food. 1 = No difficulty   Recreational activities in which you take some force or impact through your arm, shoulder or hand (e.g., golf, hammering, tennis, etc.). 5 = Unable  During the past week, to what extent has your arm, shoulder or hand problem interfered with your normal social activities with family, friends, neighbors or groups?  4 = Quite a bit  During the past week, were you limited in your work or other regular daily activities as a result of your arm, shoulder or hand problem? 5 = Unable  Rate the severity of  the following symptoms in the last week: Arm, Shoulder, or hand pain. 5 = Extreme  Rate the severity of the following symptoms in the last week: Tingling (pins and needles) in your arm, shoulder or hand. 1 = none  During the past week, how much difficulty have you had sleeping because of the pain in your arm, shoulder or hand?  4 = Severe difficulty   (A QuickDASH score may not be calculated if there is greater than 1 missing item.)  Quick Dash Disability/Symptom Score: 54.5  Minimally Clinically Important Difference (MCID): 15-20 points  (Franchignoni, F. et al. (2013). Minimally clinically important difference of the disabilities of the arm, shoulder, and hand outcome measures (DASH) and its shortened version (Quick DASH). Journal of Orthopaedic & Sports Physical Therapy, 44(1), 30-39)   COGNITION: Overall cognitive status: Within functional limits for tasks assessed      POSTURE: Mild shoulder rounding BIL   UPPER EXTREMITY ROM:  Active ROM Right eval Left eval  Shoulder flexion WFL 142  Shoulder extension    Shoulder abduction [facility] 95  Shoulder adduction    Shoulder internal rotation  65  Shoulder external rotation  25  Elbow flexion    Elbow extension    Wrist flexion    Wrist extension    Wrist ulnar deviation    Wrist radial deviation    Wrist pronation    Wrist supination    (Blank rows = not tested)  UPPER EXTREMITY MMT: strength assessment limited by pain at initial eval   MMT Right eval Left eval  Shoulder flexion    Shoulder extension    Shoulder abduction    Shoulder adduction    Shoulder internal rotation    Shoulder external rotation    Middle trapezius    Lower trapezius    Elbow flexion    Elbow extension    Wrist flexion    Wrist extension    Wrist ulnar deviation    Wrist radial deviation    Wrist pronation    Wrist supination    Grip strength (lbs)    (Blank rows = not tested)  PALPATION:  Mild tenderness to palpation along  posterior and superior aspect of L shoulder                                                                                                                              TREATMENT DATE:   [facility] Adult PT Treatment:                                                DATE: 11/26/2024   Initial evaluation: see patient education and home exercise program as noted below     PATIENT EDUCATION: Education details: reviewed initial home exercise program; discussion of POC, prognosis and goals for skilled PT   Person educated: Patient Education method: Explanation, Demonstration, and Handouts Education comprehension: verbalized understanding, returned demonstration, and needs further education  HOME EXERCISE PROGRAM: Access Code: [email or web address] URL: [email or web address] Date: 11/26/2024 Prepared by: Marko Molt  Exercises - Seated Scapular Retraction  - 1-2 x daily - 5 x weekly - 1 sets - 10 reps - 3 sec hold - Seated Shoulder Shrugs  - 1-2 x daily - 5 x weekly - 1 sets - 10 reps - 3 sec hold - Isometric Shoulder Extension at Wall  - 1-2 x daily - 5 x weekly - 1 sets - 10 reps - 5 sec hold - Standing Isometric Shoulder Flexion with Doorway - Arm Bent  - 1-2 x daily - 5 x weekly - 1 sets - 10 reps - 5 sec hold  ASSESSMENT:  CLINICAL IMPRESSION: Lyall is a 48 y.o. male who was seen today for physical therapy evaluation and treatment for persistent R shoulder pain with  mobility deficits. He is also demonstrating decreased postural endurance, and limited strength d/t severity of symptoms. He has related pain and difficulty with overhead reaching, lifting, bathing, heavy household chores, and impaired sleep quality. He requires skilled PT services at this time to address relevant deficits and improve overall function.     OBJECTIVE IMPAIRMENTS: decreased activity tolerance, decreased ROM, decreased strength, impaired UE functional use, improper body mechanics, postural dysfunction, and  pain.   ACTIVITY LIMITATIONS: carrying, lifting, sleeping, bed mobility, bathing, dressing, and reach over head  PARTICIPATION LIMITATIONS: meal prep, cleaning, laundry, driving, shopping, and community activity  PERSONAL FACTORS: Profession, Time since onset of injury/illness/exacerbation, and 3+ comorbidities: Relevant PMHx includes DM, Gout, PAD, PVD, tobacco use are also affecting patient's functional outcome.   REHAB POTENTIAL: Fair    CLINICAL DECISION MAKING: Stable/uncomplicated  EVALUATION COMPLEXITY: Low   GOALS: Goals reviewed with patient? YES  SHORT TERM GOALS: Target date: 12/24/2024   Patient will be independent with initial home program at least 3 days/week.  Baseline: provided at eval Goal Status: INITIAL   2.  Patient will demonstrate improved postural awareness for at least 15 minutes while seated without need for cueing from PT.  Baseline: see objective measures Goal Status: INITIAL   3.  Patient will demonstrate improved R shoulder flexion and abduction AROM by at least 10 degrees Baseline: see objective measures Goal Status: INITIAL     LONG TERM GOALS: Target date: 01/21/2025  Patient will report improved overall functional ability with QuickDash score of 20 or less.  Baseline: 54.5 Goal Status: INITIAL    2.  Patient will demonstrate at least 4+/5 MMT  Baseline: see objective measures  Goal status: INITIAL  3.  Patient will demonstrate ability to perform overhead lifting of at least 10# using appropriate body mechanics and with no more than minimal pain in order to safely perform normal daily/occupational tasks.   Baseline: unable d/t pain with overhead reaching   Goal Status: INITIAL   4.  Patient will report ability to sleep at least 6 hours/night with minimal pain-related sleep disturbance Goal status: INITIAL    PLAN:  PT FREQUENCY: 1-2x/week  PT DURATION: 8 weeks  PLANNED INTERVENTIONS: 97164- PT Re-evaluation, 97750- Physical  Performance Testing, 97110-Therapeutic exercises, 97530- Therapeutic activity, [id number]- Neuromuscular re-education, 97535- Self Care, [id number]- Manual therapy, [id number]- Electrical stimulation (unattended), [phone]- Electrical stimulation (manual), 20560 (1-2 muscles), 20561 (3+ muscles)- Dry Needling, Patient/Family education, Taping, Joint mobilization, Spinal mobilization, Cryotherapy, and Moist heat  For all possible CPT codes, reference the Planned Interventions line above.     Check all conditions that are expected to impact treatment: {Conditions expected to impact treatment:Diabetes mellitus and Social determinants of health   If treatment provided at initial evaluation, no treatment charged due to lack of authorization.       PLAN FOR NEXT SESSION: address L shoulder mobility, pain modulation with manual therapy, modalities, UBE as indicated, periscapular strengthening, isometric shoulder strengthening   Marko Molt, PT, DPT  12/04/2024 8:16 AM

## 2024-12-04 ENCOUNTER — Telehealth: Payer: Self-pay | Admitting: Lab

## 2024-12-04 NOTE — Telephone Encounter (Signed)
 Patient called stated 2-3 days after starting fungus treatment Lamisil  his feet are now red ans swollen causing pain instructed patient to discontinue meds do foot soaks until further advise is given by provider. Patient states wanted him to know.

## 2024-12-07 NOTE — Addendum Note (Signed)
 Addended by: JOSHUA GUN on: 12/07/2024 01:15 PM   Modules accepted: Orders

## 2024-12-10 ENCOUNTER — Ambulatory Visit

## 2024-12-14 ENCOUNTER — Ambulatory Visit

## 2024-12-15 ENCOUNTER — Ambulatory Visit

## 2024-12-15 DIAGNOSIS — M25511 Pain in right shoulder: Secondary | ICD-10-CM | POA: Diagnosis not present

## 2024-12-15 DIAGNOSIS — G8929 Other chronic pain: Secondary | ICD-10-CM

## 2024-12-15 NOTE — Therapy (Signed)
 " OUTPATIENT PHYSICAL THERAPY SHOULDER EVALUATION   Patient Name: Fred Green MRN: [id number] DOB:1976/08/11, 48 y.o., male Today's Date: 12/15/2024  END OF SESSION:  PT End of Session - 12/15/24 0933     Visit Number 2    Number of Visits 16    Date for Recertification  01/21/25    Authorization Type [employer] medicaid    PT Start Time 0915    PT Stop Time 0955    PT Time Calculation (min) 40 min    Activity Tolerance Patient limited by pain    Behavior During Therapy [facility] for tasks assessed/performed          Past Medical History:  Diagnosis Date   Diabetes mellitus without complication (HCC)    GERD (gastroesophageal reflux disease)    Gout    Peripheral arterial disease    Peripheral vascular disease    Tobacco abuse    Past Surgical History:  Procedure Laterality Date   ABDOMINAL AORTOGRAM N/A 08/03/2024   Procedure: ABDOMINAL AORTOGRAM;  Surgeon: Sheree Penne Bruckner, MD;  Location: [facility] INVASIVE CV LAB;  Service: Cardiovascular;  Laterality: N/A;   APPLICATION OF WOUND VAC Right 02/20/2024   Procedure: APPLICATION OF WOUND VAC;  Surgeon: Sheree Penne Bruckner, MD;  Location: [facility] OR;  Service: Vascular;  Laterality: Right;   FASCIOTOMY Right 01/26/2024   Procedure: RIGHT LOWER LEG FOUR COMPARTMENT FASCIOTOMY;  Surgeon: Sheree Penne Bruckner, MD;  Location: [facility] OR;  Service: Vascular;  Laterality: Right;   INCISION AND DRAINAGE OF WOUND Right 02/20/2024   Procedure: IRRIGATION AND DEBRIDEMENT WOUND RIGHT LEG;  Surgeon: Sheree Penne Bruckner, MD;  Location: [facility] OR;  Service: Vascular;  Laterality: Right;   INSERTION OF ILIAC STENT Right 01/26/2024   Procedure: INSERTION OF RIGHT POPITEAL ARTERY STENT;  Surgeon: Sheree Penne Bruckner, MD;  Location: [facility] OR;  Service: Vascular;  Laterality: Right;   LOWER EXTREMITY ANGIOGRAM Right 01/26/2024   Procedure: RIGHT LOWER EXTREMITY ANGIOGRAM;  Surgeon: Sheree Penne Bruckner, MD;  Location: [facility] OR;  Service: Vascular;   Laterality: Right;   LOWER EXTREMITY ANGIOGRAPHY N/A 08/03/2024   Procedure: Lower Extremity Angiography;  Surgeon: Sheree Penne Bruckner, MD;  Location: [facility] INVASIVE CV LAB;  Service: Cardiovascular;  Laterality: N/A;   LOWER EXTREMITY INTERVENTION N/A 08/03/2024   Procedure: LOWER EXTREMITY INTERVENTION;  Surgeon: Sheree Penne Bruckner, MD;  Location: [facility] INVASIVE CV LAB;  Service: Cardiovascular;  Laterality: N/A;   NO PAST SURGERIES     PATCH ANGIOPLASTY Right 01/26/2024   Procedure: PATCH ANGIOPLASTY USING GEORGE LISLE;  Surgeon: Sheree Penne Bruckner, MD;  Location: [facility] OR;  Service: Vascular;  Laterality: Right;   THROMBECTOMY OF BYPASS GRAFT FEMORAL- POPLITEAL ARTERY Right 01/26/2024   Procedure: RIGHT LOWER EXTREMITY THROMBECTOMY;  Surgeon: Sheree Penne Bruckner, MD;  Location: [facility] OR;  Service: Vascular;  Laterality: Right;   Patient Active Problem List   Diagnosis Date Noted   Diabetes [facility])    PAD (peripheral artery disease) 02/20/2024   Vascular complication 01/26/2024   Critical limb ischemia of right lower extremity (HCC) 01/26/2024   Back pain 01/11/2016   Tobacco abuse 01/11/2016   Abnormal EKG 01/11/2016   GERD (gastroesophageal reflux disease)    Gastroesophageal reflux disease without esophagitis     PCP: Sebastian Jerilyn HERO, FNP  REFERRING PROVIDER: Beverley Evalene BIRCH, MD  REFERRING DIAG: pain in right shoulder   THERAPY DIAG:  Chronic right shoulder pain  Rationale for Evaluation and Treatment: Rehabilitation  ONSET DATE: 11/10/2024 date of referral  SUBJECTIVE:                                                                                                                                                                                      SUBJECTIVE STATEMENT: 12/15/2024 Pt reports the exercises are causing slight pain increase especially when he shrugs. He notes no change in pain overall. He has improved pain levels during PROM.  EVAL -  Patient reports that he has R shoulder pain that began with a pinched nerve feeling. Patient states that it takes about 30 minutes to subside when it flares up. He states that he cannot recall an injury, but he worked for years doing gift wrapping and packaging, which required a lot of lifting. Those rolls could go back to 80 pounds   Hand dominance: Right  PERTINENT HISTORY: Relevant PMHx includes DM, Gout, PAD, PVD, tobacco use  PAIN:  Are you having pain?  Yes: NPRS scale: 5/10 current  Pain location: anterior shoulder primarily  Pain description: pulsating Aggravating factors: rolling over on R side, lift arm  Relieving factors: steroid injection (helped for first 5-6 hours)  PRECAUTIONS: None  RED FLAGS: None   WEIGHT BEARING RESTRICTIONS: No  FALLS:  Has patient fallen in last 6 months? No  LIVING ENVIRONMENT: Lives with: lives with their family Lives in: House/apartment Has following equipment at home: None  OCCUPATION: N/a  PLOF: Independent  PATIENT GOALS:to have less shoulder pain with lifting his arm, laying on his R side  NEXT MD VISIT: none scheduled with referring provider  OBJECTIVE:  Note: Objective measures were completed at Evaluation unless otherwise noted.  DIAGNOSTIC FINDINGS:  X-ray of the right shoulder was performed today, showing no evidence of arthritis or stress fracture. Independent interpretation performed by me indicates that further imaging, such as an MRI, may be necessary if symptoms do not improve with initial treatment.   PATIENT SURVEYS:  Quick Dash:  QUICK DASH  Please rate your ability do the following activities in the last week by selecting the number below the appropriate response.   Activities Rating  Open a tight or new jar.  1 = No difficulty   Do heavy household chores (e.g., wash walls, floors). 3 = Moderate difficulty  Carry a shopping bag or briefcase 1 = No difficulty   Wash your back. 5 = Unable  Use a  knife to cut food. 1 = No difficulty   Recreational activities in which you take some force or impact through your arm, shoulder or hand (e.g., golf, hammering, tennis, etc.). 5 = Unable  During the past week, to what extent has your arm, shoulder or hand problem interfered  with your normal social activities with family, friends, neighbors or groups?  4 = Quite a bit  During the past week, were you limited in your work or other regular daily activities as a result of your arm, shoulder or hand problem? 5 = Unable  Rate the severity of the following symptoms in the last week: Arm, Shoulder, or hand pain. 5 = Extreme  Rate the severity of the following symptoms in the last week: Tingling (pins and needles) in your arm, shoulder or hand. 1 = none  During the past week, how much difficulty have you had sleeping because of the pain in your arm, shoulder or hand?  4 = Severe difficulty   (A QuickDASH score may not be calculated if there is greater than 1 missing item.)  Quick Dash Disability/Symptom Score: 54.5  Minimally Clinically Important Difference (MCID): 15-20 points  (Franchignoni, F. et al. (2013). Minimally clinically important difference of the disabilities of the arm, shoulder, and hand outcome measures (DASH) and its shortened version (Quick DASH). Journal of Orthopaedic & Sports Physical Therapy, 44(1), 30-39)   COGNITION: Overall cognitive status: Within functional limits for tasks assessed      POSTURE: Mild shoulder rounding BIL   UPPER EXTREMITY ROM:   Active ROM Right eval Left eval  Shoulder flexion WFL 142  Shoulder extension    Shoulder abduction WFL 95  Shoulder adduction    Shoulder internal rotation  65  Shoulder external rotation  25  Elbow flexion    Elbow extension    Wrist flexion    Wrist extension    Wrist ulnar deviation    Wrist radial deviation    Wrist pronation    Wrist supination    (Blank rows = not tested)  UPPER EXTREMITY MMT: strength  assessment limited by pain at initial eval   MMT Right eval Left eval  Shoulder flexion    Shoulder extension    Shoulder abduction    Shoulder adduction    Shoulder internal rotation    Shoulder external rotation    Middle trapezius    Lower trapezius    Elbow flexion    Elbow extension    Wrist flexion    Wrist extension    Wrist ulnar deviation    Wrist radial deviation    Wrist pronation    Wrist supination    Grip strength (lbs)    (Blank rows = not tested)  PALPATION:  Mild tenderness to palpation along posterior and superior aspect of L shoulder                                                                                                                              TREATMENT DATE:  [facility] Adult PT Treatment:  DATE: 12/15/2024   Neuro Re-Ed Dowel shoulder flexion x 15  Pulley's flexion and scaption x 10-15 each  Scap retractions 2 x 10   Manual:  PROM R shoulder all directions  Grade II/III glenohumeral jt mobilizations   [facility] Adult PT Treatment:                                                DATE: 11/26/2024   Initial evaluation: see patient education and home exercise program as noted below     PATIENT EDUCATION: Education details: reviewed initial home exercise program; discussion of POC, prognosis and goals for skilled PT   Person educated: Patient Education method: Explanation, Demonstration, and Handouts Education comprehension: verbalized understanding, returned demonstration, and needs further education  HOME EXERCISE PROGRAM: Access Code: [email or web address] URL: [email or web address] Date: 11/26/2024 Prepared by: Marko Molt  Exercises - Seated Scapular Retraction  - 1-2 x daily - 5 x weekly - 1 sets - 10 reps - 3 sec hold - Seated Shoulder Shrugs  - 1-2 x daily - 5 x weekly - 1 sets - 10 reps - 3 sec hold - Isometric Shoulder Extension at Wall  - 1-2 x daily - 5 x weekly - 1 sets - 10 reps  - 5 sec hold - Standing Isometric Shoulder Flexion with Doorway - Arm Bent  - 1-2 x daily - 5 x weekly - 1 sets - 10 reps - 5 sec hold  ASSESSMENT:  CLINICAL IMPRESSION: 12/15/2024 Pt introduced to ROM exercises on this date. He was able to achieve full shoulder flexion ROM with the pulleys and dowel. Corrections made to scapular retractions which improved pain during exercise. Pt exited with decreased pain. The pt will benefit from skilled physical therapy to decrease pain and increase function.    EVAL Fred Green is a 48 y.o. male who was seen today for physical therapy evaluation and treatment for persistent R shoulder pain with mobility deficits. He is also demonstrating decreased postural endurance, and limited strength d/t severity of symptoms. He has related pain and difficulty with overhead reaching, lifting, bathing, heavy household chores, and impaired sleep quality. He requires skilled PT services at this time to address relevant deficits and improve overall function.     OBJECTIVE IMPAIRMENTS: decreased activity tolerance, decreased ROM, decreased strength, impaired UE functional use, improper body mechanics, postural dysfunction, and pain.   ACTIVITY LIMITATIONS: carrying, lifting, sleeping, bed mobility, bathing, dressing, and reach over head  PARTICIPATION LIMITATIONS: meal prep, cleaning, laundry, driving, shopping, and community activity  PERSONAL FACTORS: Profession, Time since onset of injury/illness/exacerbation, and 3+ comorbidities: Relevant PMHx includes DM, Gout, PAD, PVD, tobacco use are also affecting patient's functional outcome.   REHAB POTENTIAL: Fair    CLINICAL DECISION MAKING: Stable/uncomplicated  EVALUATION COMPLEXITY: Low   GOALS: Goals reviewed with patient? YES  SHORT TERM GOALS: Target date: 12/24/2024   Patient will be independent with initial home program at least 3 days/week.  Baseline: provided at eval Goal Status: INITIAL   2.  Patient will  demonstrate improved postural awareness for at least 15 minutes while seated without need for cueing from PT.  Baseline: see objective measures Goal Status: INITIAL   3.  Patient will demonstrate improved R shoulder flexion and abduction AROM by at least 10 degrees Baseline: see objective measures Goal Status: INITIAL     LONG TERM GOALS: Target  date: 01/21/2025  Patient will report improved overall functional ability with QuickDash score of 20 or less.  Baseline: 54.5 Goal Status: INITIAL    2.  Patient will demonstrate at least 4+/5 MMT  Baseline: see objective measures  Goal status: INITIAL  3.  Patient will demonstrate ability to perform overhead lifting of at least 10# using appropriate body mechanics and with no more than minimal pain in order to safely perform normal daily/occupational tasks.   Baseline: unable d/t pain with overhead reaching   Goal Status: INITIAL   4.  Patient will report ability to sleep at least 6 hours/night with minimal pain-related sleep disturbance Goal status: INITIAL    PLAN:  PT FREQUENCY: 1-2x/week  PT DURATION: 8 weeks  PLANNED INTERVENTIONS: 97164- PT Re-evaluation, 97750- Physical Performance Testing, 97110-Therapeutic exercises, 97530- Therapeutic activity, [id number]- Neuromuscular re-education, 97535- Self Care, [id number]- Manual therapy, [id number]- Electrical stimulation (unattended), [phone]- Electrical stimulation (manual), 20560 (1-2 muscles), 20561 (3+ muscles)- Dry Needling, Patient/Family education, Taping, Joint mobilization, Spinal mobilization, Cryotherapy, and Moist heat  For all possible CPT codes, reference the Planned Interventions line above.     Check all conditions that are expected to impact treatment: {Conditions expected to impact treatment:Diabetes mellitus and Social determinants of health   If treatment provided at initial evaluation, no treatment charged due to lack of authorization.       PLAN FOR NEXT SESSION: address  L shoulder mobility, pain modulation with manual therapy, modalities, UBE as indicated, periscapular strengthening, isometric shoulder strengthening   Marijo Berber PT, DPT 12/15/2024 9:35 AM    "

## 2024-12-21 ENCOUNTER — Ambulatory Visit

## 2024-12-21 DIAGNOSIS — M25511 Pain in right shoulder: Secondary | ICD-10-CM | POA: Diagnosis not present

## 2024-12-21 DIAGNOSIS — G8929 Other chronic pain: Secondary | ICD-10-CM

## 2024-12-21 NOTE — Therapy (Signed)
 " OUTPATIENT PHYSICAL THERAPY TREATMENT NOTE   Patient Name: Fred Green MRN: [id number] DOB:1976-09-19, 48 y.o., male Today's Date: 12/21/2024  END OF SESSION:  PT End of Session - 12/21/24 0904     Visit Number 3    Number of Visits 16    Date for Recertification  01/21/25    Authorization Type [employer] medicaid    PT Start Time 0915    PT Stop Time 0955    PT Time Calculation (min) 40 min    Activity Tolerance Patient limited by pain;Patient tolerated treatment well    Behavior During Therapy [facility] for tasks assessed/performed          Past Medical History:  Diagnosis Date   Diabetes mellitus without complication (HCC)    GERD (gastroesophageal reflux disease)    Gout    Peripheral arterial disease    Peripheral vascular disease    Tobacco abuse    Past Surgical History:  Procedure Laterality Date   ABDOMINAL AORTOGRAM N/A 08/03/2024   Procedure: ABDOMINAL AORTOGRAM;  Surgeon: Sheree Penne Bruckner, MD;  Location: [facility] INVASIVE CV LAB;  Service: Cardiovascular;  Laterality: N/A;   APPLICATION OF WOUND VAC Right 02/20/2024   Procedure: APPLICATION OF WOUND VAC;  Surgeon: Sheree Penne Bruckner, MD;  Location: [facility] OR;  Service: Vascular;  Laterality: Right;   FASCIOTOMY Right 01/26/2024   Procedure: RIGHT LOWER LEG FOUR COMPARTMENT FASCIOTOMY;  Surgeon: Sheree Penne Bruckner, MD;  Location: [facility] OR;  Service: Vascular;  Laterality: Right;   INCISION AND DRAINAGE OF WOUND Right 02/20/2024   Procedure: IRRIGATION AND DEBRIDEMENT WOUND RIGHT LEG;  Surgeon: Sheree Penne Bruckner, MD;  Location: [facility] OR;  Service: Vascular;  Laterality: Right;   INSERTION OF ILIAC STENT Right 01/26/2024   Procedure: INSERTION OF RIGHT POPITEAL ARTERY STENT;  Surgeon: Sheree Penne Bruckner, MD;  Location: [facility] OR;  Service: Vascular;  Laterality: Right;   LOWER EXTREMITY ANGIOGRAM Right 01/26/2024   Procedure: RIGHT LOWER EXTREMITY ANGIOGRAM;  Surgeon: Sheree Penne Bruckner, MD;  Location: [facility]  OR;  Service: Vascular;  Laterality: Right;   LOWER EXTREMITY ANGIOGRAPHY N/A 08/03/2024   Procedure: Lower Extremity Angiography;  Surgeon: Sheree Penne Bruckner, MD;  Location: [facility] INVASIVE CV LAB;  Service: Cardiovascular;  Laterality: N/A;   LOWER EXTREMITY INTERVENTION N/A 08/03/2024   Procedure: LOWER EXTREMITY INTERVENTION;  Surgeon: Sheree Penne Bruckner, MD;  Location: [facility] INVASIVE CV LAB;  Service: Cardiovascular;  Laterality: N/A;   NO PAST SURGERIES     PATCH ANGIOPLASTY Right 01/26/2024   Procedure: PATCH ANGIOPLASTY USING GEORGE LISLE;  Surgeon: Sheree Penne Bruckner, MD;  Location: [facility] OR;  Service: Vascular;  Laterality: Right;   THROMBECTOMY OF BYPASS GRAFT FEMORAL- POPLITEAL ARTERY Right 01/26/2024   Procedure: RIGHT LOWER EXTREMITY THROMBECTOMY;  Surgeon: Sheree Penne Bruckner, MD;  Location: [facility] OR;  Service: Vascular;  Laterality: Right;   Patient Active Problem List   Diagnosis Date Noted   Diabetes [facility])    PAD (peripheral artery disease) 02/20/2024   Vascular complication 01/26/2024   Critical limb ischemia of right lower extremity (HCC) 01/26/2024   Back pain 01/11/2016   Tobacco abuse 01/11/2016   Abnormal EKG 01/11/2016   GERD (gastroesophageal reflux disease)    Gastroesophageal reflux disease without esophagitis     PCP: Sebastian Jerilyn HERO, FNP  REFERRING PROVIDER: Beverley Evalene BIRCH, MD  REFERRING DIAG: pain in right shoulder   THERAPY DIAG:  Chronic right shoulder pain  Rationale for Evaluation and Treatment: Rehabilitation  ONSET DATE: 11/10/2024  date of referral  SUBJECTIVE:                                                                                                                                                                                      SUBJECTIVE STATEMENT: 12/21/2024 Patient reports that when he working his shoulder out it feels ok, but that it hurts worse when he rests.   EVAL - Patient reports that he has R  shoulder pain that began with a pinched nerve feeling. Patient states that it takes about 30 minutes to subside when it flares up. He states that he cannot recall an injury, but he worked for years doing gift wrapping and packaging, which required a lot of lifting. Those rolls could go back to 80 pounds   Hand dominance: Right  PERTINENT HISTORY: Relevant PMHx includes DM, Gout, PAD, PVD, tobacco use  PAIN:  Are you having pain?  Yes: NPRS scale: 5/10 current  Pain location: anterior shoulder primarily  Pain description: pulsating Aggravating factors: rolling over on R side, lift arm  Relieving factors: steroid injection (helped for first 5-6 hours)  PRECAUTIONS: None  RED FLAGS: None   WEIGHT BEARING RESTRICTIONS: No  FALLS:  Has patient fallen in last 6 months? No  LIVING ENVIRONMENT: Lives with: lives with their family Lives in: House/apartment Has following equipment at home: None  OCCUPATION: N/a  PLOF: Independent  PATIENT GOALS:to have less shoulder pain with lifting his arm, laying on his R side  NEXT MD VISIT: none scheduled with referring provider  OBJECTIVE:  Note: Objective measures were completed at Evaluation unless otherwise noted.  DIAGNOSTIC FINDINGS:  X-ray of the right shoulder was performed today, showing no evidence of arthritis or stress fracture. Independent interpretation performed by me indicates that further imaging, such as an MRI, may be necessary if symptoms do not improve with initial treatment.   PATIENT SURVEYS:  Quick Dash:  QUICK DASH  Please rate your ability do the following activities in the last week by selecting the number below the appropriate response.   Activities Rating  Open a tight or new jar.  1 = No difficulty   Do heavy household chores (e.g., wash walls, floors). 3 = Moderate difficulty  Carry a shopping bag or briefcase 1 = No difficulty   Wash your back. 5 = Unable  Use a knife to cut food. 1 = No  difficulty   Recreational activities in which you take some force or impact through your arm, shoulder or hand (e.g., golf, hammering, tennis, etc.). 5 = Unable  During the past week, to what extent has your arm, shoulder or hand problem interfered with your  normal social activities with family, friends, neighbors or groups?  4 = Quite a bit  During the past week, were you limited in your work or other regular daily activities as a result of your arm, shoulder or hand problem? 5 = Unable  Rate the severity of the following symptoms in the last week: Arm, Shoulder, or hand pain. 5 = Extreme  Rate the severity of the following symptoms in the last week: Tingling (pins and needles) in your arm, shoulder or hand. 1 = none  During the past week, how much difficulty have you had sleeping because of the pain in your arm, shoulder or hand?  4 = Severe difficulty   (A QuickDASH score may not be calculated if there is greater than 1 missing item.)  Quick Dash Disability/Symptom Score: 54.5  Minimally Clinically Important Difference (MCID): 15-20 points  (Franchignoni, F. et al. (2013). Minimally clinically important difference of the disabilities of the arm, shoulder, and hand outcome measures (DASH) and its shortened version (Quick DASH). Journal of Orthopaedic & Sports Physical Therapy, 44(1), 30-39)   COGNITION: Overall cognitive status: Within functional limits for tasks assessed      POSTURE: Mild shoulder rounding BIL   UPPER EXTREMITY ROM:   Active ROM Right eval Left eval  Shoulder flexion WFL 142  Shoulder extension    Shoulder abduction WFL 95  Shoulder adduction    Shoulder internal rotation  65  Shoulder external rotation  25  Elbow flexion    Elbow extension    Wrist flexion    Wrist extension    Wrist ulnar deviation    Wrist radial deviation    Wrist pronation    Wrist supination    (Blank rows = not tested)  UPPER EXTREMITY MMT: strength assessment limited by pain at  initial eval   MMT Right eval Left eval  Shoulder flexion    Shoulder extension    Shoulder abduction    Shoulder adduction    Shoulder internal rotation    Shoulder external rotation    Middle trapezius    Lower trapezius    Elbow flexion    Elbow extension    Wrist flexion    Wrist extension    Wrist ulnar deviation    Wrist radial deviation    Wrist pronation    Wrist supination    Grip strength (lbs)    (Blank rows = not tested)  PALPATION:  Mild tenderness to palpation along posterior and superior aspect of L shoulder                                                                                                                              TREATMENT DATE:  [facility] Adult PT Treatment:  DATE: 12/21/24 Therapeutic Exercise: UBE level 1 3'/3' fwd/bwd while gathering subjective and planning session with patient PROM R shoulder all directions  Dowel shoulder flexion x 15  Pulley's flexion and scaption x 10-15 each  Manual Therapy: Grade II/III glenohumeral jt mobilizations  Neuromuscular re-ed: Supine isometrics flex/ext/abd/add/ER/IR 3 hold x10 ea  [facility] Adult PT Treatment:                                                DATE: 12/15/24 Neuro Re-Ed Dowel shoulder flexion x 15  Pulley's flexion and scaption x 10-15 each  Scap retractions 2 x 10   Manual:  PROM R shoulder all directions  Grade II/III glenohumeral jt mobilizations   [facility] Adult PT Treatment:                                                DATE: 11/26/2024   Initial evaluation: see patient education and home exercise program as noted below     PATIENT EDUCATION: Education details: reviewed initial home exercise program; discussion of POC, prognosis and goals for skilled PT   Person educated: Patient Education method: Explanation, Demonstration, and Handouts Education comprehension: verbalized understanding, returned demonstration, and needs further  education  HOME EXERCISE PROGRAM: Access Code: [email or web address] URL: [email or web address] Date: 11/26/2024 Prepared by: Marko Molt  Exercises - Seated Scapular Retraction  - 1-2 x daily - 5 x weekly - 1 sets - 10 reps - 3 sec hold - Seated Shoulder Shrugs  - 1-2 x daily - 5 x weekly - 1 sets - 10 reps - 3 sec hold - Isometric Shoulder Extension at Wall  - 1-2 x daily - 5 x weekly - 1 sets - 10 reps - 5 sec hold - Standing Isometric Shoulder Flexion with Doorway - Arm Bent  - 1-2 x daily - 5 x weekly - 1 sets - 10 reps - 5 sec hold  ASSESSMENT:  CLINICAL IMPRESSION: 12/21/2024 Patient reports that his shoulder will feel good while he is utilizing it, but it hurts more when he rests. He states he felt good during the last session, but the pain returned afterwards. Session today continued to focus on ROM, pain modulation, and gentle strengthening with isometrics. Patient continues to benefit from skilled PT services and should be progressed as able to improve functional independence.   EVAL Vansh is a 48 y.o. male who was seen today for physical therapy evaluation and treatment for persistent R shoulder pain with mobility deficits. He is also demonstrating decreased postural endurance, and limited strength d/t severity of symptoms. He has related pain and difficulty with overhead reaching, lifting, bathing, heavy household chores, and impaired sleep quality. He requires skilled PT services at this time to address relevant deficits and improve overall function.     OBJECTIVE IMPAIRMENTS: decreased activity tolerance, decreased ROM, decreased strength, impaired UE functional use, improper body mechanics, postural dysfunction, and pain.   ACTIVITY LIMITATIONS: carrying, lifting, sleeping, bed mobility, bathing, dressing, and reach over head  PARTICIPATION LIMITATIONS: meal prep, cleaning, laundry, driving, shopping, and community activity  PERSONAL FACTORS: Profession, Time since  onset of injury/illness/exacerbation, and 3+ comorbidities: Relevant PMHx includes DM, Gout, PAD, PVD, tobacco use are also affecting patient's functional outcome.  REHAB POTENTIAL: Fair    CLINICAL DECISION MAKING: Stable/uncomplicated  EVALUATION COMPLEXITY: Low   GOALS: Goals reviewed with patient? YES  SHORT TERM GOALS: Target date: 12/24/2024   Patient will be independent with initial home program at least 3 days/week.  Baseline: provided at eval Goal Status: INITIAL   2.  Patient will demonstrate improved postural awareness for at least 15 minutes while seated without need for cueing from PT.  Baseline: see objective measures Goal Status: INITIAL   3.  Patient will demonstrate improved R shoulder flexion and abduction AROM by at least 10 degrees Baseline: see objective measures Goal Status: INITIAL     LONG TERM GOALS: Target date: 01/21/2025  Patient will report improved overall functional ability with QuickDash score of 20 or less.  Baseline: 54.5 Goal Status: INITIAL    2.  Patient will demonstrate at least 4+/5 MMT  Baseline: see objective measures  Goal status: INITIAL  3.  Patient will demonstrate ability to perform overhead lifting of at least 10# using appropriate body mechanics and with no more than minimal pain in order to safely perform normal daily/occupational tasks.   Baseline: unable d/t pain with overhead reaching   Goal Status: INITIAL   4.  Patient will report ability to sleep at least 6 hours/night with minimal pain-related sleep disturbance Goal status: INITIAL    PLAN:  PT FREQUENCY: 1-2x/week  PT DURATION: 8 weeks  PLANNED INTERVENTIONS: 97164- PT Re-evaluation, 97750- Physical Performance Testing, 97110-Therapeutic exercises, 97530- Therapeutic activity, [id number]- Neuromuscular re-education, 97535- Self Care, [id number]- Manual therapy, [id number]- Electrical stimulation (unattended), [phone]- Electrical stimulation (manual), 20560 (1-2 muscles),  20561 (3+ muscles)- Dry Needling, Patient/Family education, Taping, Joint mobilization, Spinal mobilization, Cryotherapy, and Moist heat  For all possible CPT codes, reference the Planned Interventions line above.     Check all conditions that are expected to impact treatment: {Conditions expected to impact treatment:Diabetes mellitus and Social determinants of health   If treatment provided at initial evaluation, no treatment charged due to lack of authorization.       PLAN FOR NEXT SESSION: address L shoulder mobility, pain modulation with manual therapy, modalities, UBE as indicated, periscapular strengthening, isometric shoulder strengthening   Corean Pouch PTA  12/21/2024 9:55 AM   "

## 2024-12-22 ENCOUNTER — Ambulatory Visit

## 2024-12-23 ENCOUNTER — Ambulatory Visit

## 2024-12-23 DIAGNOSIS — M25511 Pain in right shoulder: Secondary | ICD-10-CM | POA: Diagnosis not present

## 2024-12-23 DIAGNOSIS — G8929 Other chronic pain: Secondary | ICD-10-CM

## 2024-12-23 NOTE — Therapy (Signed)
 " OUTPATIENT PHYSICAL THERAPY TREATMENT NOTE   Patient Name: Fred Green MRN: [id number] DOB:[birth year], 48 y.o., male Today's Date: 12/23/2024  END OF SESSION:  PT End of Session - 12/23/24 1151     Visit Number 4    Number of Visits 16    Date for Recertification  01/21/25    Authorization Type [employer] medicaid    Authorization Time Period 12 visits 12/10/24-01/21/25    PT Start Time 1130    PT Stop Time 1210    PT Time Calculation (min) 40 min    Activity Tolerance Patient limited by pain;Patient tolerated treatment well    Behavior During Therapy [facility] for tasks assessed/performed           Past Medical History:  Diagnosis Date   Diabetes mellitus without complication (HCC)    GERD (gastroesophageal reflux disease)    Gout    Peripheral arterial disease    Peripheral vascular disease    Tobacco abuse    Past Surgical History:  Procedure Laterality Date   ABDOMINAL AORTOGRAM N/A 08/03/2024   Procedure: ABDOMINAL AORTOGRAM;  Surgeon: Sheree Penne Bruckner, MD;  Location: [facility] INVASIVE CV LAB;  Service: Cardiovascular;  Laterality: N/A;   APPLICATION OF WOUND VAC Right 02/20/2024   Procedure: APPLICATION OF WOUND VAC;  Surgeon: Sheree Penne Bruckner, MD;  Location: [facility] OR;  Service: Vascular;  Laterality: Right;   FASCIOTOMY Right 01/26/2024   Procedure: RIGHT LOWER LEG FOUR COMPARTMENT FASCIOTOMY;  Surgeon: Sheree Penne Bruckner, MD;  Location: [facility] OR;  Service: Vascular;  Laterality: Right;   INCISION AND DRAINAGE OF WOUND Right 02/20/2024   Procedure: IRRIGATION AND DEBRIDEMENT WOUND RIGHT LEG;  Surgeon: Sheree Penne Bruckner, MD;  Location: [facility] OR;  Service: Vascular;  Laterality: Right;   INSERTION OF ILIAC STENT Right 01/26/2024   Procedure: INSERTION OF RIGHT POPITEAL ARTERY STENT;  Surgeon: Sheree Penne Bruckner, MD;  Location: [facility] OR;  Service: Vascular;  Laterality: Right;   LOWER EXTREMITY ANGIOGRAM Right 01/26/2024   Procedure: RIGHT LOWER EXTREMITY  ANGIOGRAM;  Surgeon: Sheree Penne Bruckner, MD;  Location: [facility] OR;  Service: Vascular;  Laterality: Right;   LOWER EXTREMITY ANGIOGRAPHY N/A 08/03/2024   Procedure: Lower Extremity Angiography;  Surgeon: Sheree Penne Bruckner, MD;  Location: [facility] INVASIVE CV LAB;  Service: Cardiovascular;  Laterality: N/A;   LOWER EXTREMITY INTERVENTION N/A 08/03/2024   Procedure: LOWER EXTREMITY INTERVENTION;  Surgeon: Sheree Penne Bruckner, MD;  Location: [facility] INVASIVE CV LAB;  Service: Cardiovascular;  Laterality: N/A;   NO PAST SURGERIES     PATCH ANGIOPLASTY Right 01/26/2024   Procedure: PATCH ANGIOPLASTY USING GEORGE LISLE;  Surgeon: Sheree Penne Bruckner, MD;  Location: [facility] OR;  Service: Vascular;  Laterality: Right;   THROMBECTOMY OF BYPASS GRAFT FEMORAL- POPLITEAL ARTERY Right 01/26/2024   Procedure: RIGHT LOWER EXTREMITY THROMBECTOMY;  Surgeon: Sheree Penne Bruckner, MD;  Location: [facility] OR;  Service: Vascular;  Laterality: Right;   Patient Active Problem List   Diagnosis Date Noted   Diabetes (HCC)    PAD (peripheral artery disease) 02/20/2024   Vascular complication 01/26/2024   Critical limb ischemia of right lower extremity (HCC) 01/26/2024   Back pain 01/11/2016   Tobacco abuse 01/11/2016   Abnormal EKG 01/11/2016   GERD (gastroesophageal reflux disease)    Gastroesophageal reflux disease without esophagitis     PCP: Sebastian Jerilyn HERO, FNP  REFERRING PROVIDER: Beverley Evalene BIRCH, MD  REFERRING DIAG: pain in right shoulder   THERAPY DIAG:  Chronic right shoulder pain  Rationale for Evaluation and Treatment: Rehabilitation  ONSET DATE: 11/10/2024 date of referral  SUBJECTIVE:                                                                                                                                                                                      SUBJECTIVE STATEMENT: 12/23/2024 Patient reports waking in the middle of the night with a lot of pain. He notes the  pain is better now. He continues to have pain relief when stretching the arm then the pain returns.   EVAL - Patient reports that he has R shoulder pain that began with a pinched nerve feeling. Patient states that it takes about 30 minutes to subside when it flares up. He states that he cannot recall an injury, but he worked for years doing gift wrapping and packaging, which required a lot of lifting. Those rolls could go back to 80 pounds   Hand dominance: Right  PERTINENT HISTORY: Relevant PMHx includes DM, Gout, PAD, PVD, tobacco use  PAIN:  Are you having pain?  Yes: NPRS scale: 5/10 current  Pain location: anterior shoulder primarily  Pain description: pulsating Aggravating factors: rolling over on R side, lift arm  Relieving factors: steroid injection (helped for first 5-6 hours)  PRECAUTIONS: None  RED FLAGS: None   WEIGHT BEARING RESTRICTIONS: No  FALLS:  Has patient fallen in last 6 months? No  LIVING ENVIRONMENT: Lives with: lives with their family Lives in: House/apartment Has following equipment at home: None  OCCUPATION: N/a  PLOF: Independent  PATIENT GOALS:to have less shoulder pain with lifting his arm, laying on his R side  NEXT MD VISIT: none scheduled with referring provider  OBJECTIVE:  Note: Objective measures were completed at Evaluation unless otherwise noted.  DIAGNOSTIC FINDINGS:  X-ray of the right shoulder was performed today, showing no evidence of arthritis or stress fracture. Independent interpretation performed by me indicates that further imaging, such as an MRI, may be necessary if symptoms do not improve with initial treatment.   PATIENT SURVEYS:  Quick Dash:  QUICK DASH  Please rate your ability do the following activities in the last week by selecting the number below the appropriate response.   Activities Rating  Open a tight or new jar.  1 = No difficulty   Do heavy household chores (e.g., wash walls, floors). 3 =  Moderate difficulty  Carry a shopping bag or briefcase 1 = No difficulty   Wash your back. 5 = Unable  Use a knife to cut food. 1 = No difficulty   Recreational activities in which you take some force or impact through your arm, shoulder or hand (e.g.,  golf, hammering, tennis, etc.). 5 = Unable  During the past week, to what extent has your arm, shoulder or hand problem interfered with your normal social activities with family, friends, neighbors or groups?  4 = Quite a bit  During the past week, were you limited in your work or other regular daily activities as a result of your arm, shoulder or hand problem? 5 = Unable  Rate the severity of the following symptoms in the last week: Arm, Shoulder, or hand pain. 5 = Extreme  Rate the severity of the following symptoms in the last week: Tingling (pins and needles) in your arm, shoulder or hand. 1 = none  During the past week, how much difficulty have you had sleeping because of the pain in your arm, shoulder or hand?  4 = Severe difficulty   (A QuickDASH score may not be calculated if there is greater than 1 missing item.)  Quick Dash Disability/Symptom Score: 54.5  Minimally Clinically Important Difference (MCID): 15-20 points  (Franchignoni, F. et al. (2013). Minimally clinically important difference of the disabilities of the arm, shoulder, and hand outcome measures (DASH) and its shortened version (Quick DASH). Journal of Orthopaedic & Sports Physical Therapy, 44(1), 30-39)   COGNITION: Overall cognitive status: Within functional limits for tasks assessed      POSTURE: Mild shoulder rounding BIL   UPPER EXTREMITY ROM:   Active ROM Right eval Left eval  Shoulder flexion WFL 142  Shoulder extension    Shoulder abduction WFL 95  Shoulder adduction    Shoulder internal rotation  65  Shoulder external rotation  25  Elbow flexion    Elbow extension    Wrist flexion    Wrist extension    Wrist ulnar deviation    Wrist radial  deviation    Wrist pronation    Wrist supination    (Blank rows = not tested)  UPPER EXTREMITY MMT: strength assessment limited by pain at initial eval   MMT Right eval Left eval  Shoulder flexion    Shoulder extension    Shoulder abduction    Shoulder adduction    Shoulder internal rotation    Shoulder external rotation    Middle trapezius    Lower trapezius    Elbow flexion    Elbow extension    Wrist flexion    Wrist extension    Wrist ulnar deviation    Wrist radial deviation    Wrist pronation    Wrist supination    Grip strength (lbs)    (Blank rows = not tested)  PALPATION:  Mild tenderness to palpation along posterior and superior aspect of L shoulder                                                                                                                              TREATMENT DATE:  [facility] Adult PT Treatment:  DATE: 12/23/24 Therapeutic Exercise: UBE level 2.5 3'/3' fwd/bwd  Dowel shoulder flexion x 15  Pulley's flexion and scaption x 10-15 each  Manual Therapy: Grade II/III glenohumeral jt mobilizations  PROM R shoulder all directions  Neuromuscular re-ed: Supine isometrics flex/ext/abd/add/ER/IR 3 hold x10 ea, second set of flexion  [facility] Adult PT Treatment:                                                DATE: 12/15/24 Neuro Re-Ed Dowel shoulder flexion x 15  Pulley's flexion and scaption x 10-15 each  Scap retractions 2 x 10   Manual:  PROM R shoulder all directions  Grade II/III glenohumeral jt mobilizations   [facility] Adult PT Treatment:                                                DATE: 11/26/2024   Initial evaluation: see patient education and home exercise program as noted below     PATIENT EDUCATION: Education details: reviewed initial home exercise program; discussion of POC, prognosis and goals for skilled PT   Person educated: Patient Education method: Explanation, Demonstration, and  Handouts Education comprehension: verbalized understanding, returned demonstration, and needs further education  HOME EXERCISE PROGRAM: Access Code: [email or web address] URL: [email or web address] Date: 11/26/2024 Prepared by: Marko Molt  Exercises - Seated Scapular Retraction  - 1-2 x daily - 5 x weekly - 1 sets - 10 reps - 3 sec hold - Seated Shoulder Shrugs  - 1-2 x daily - 5 x weekly - 1 sets - 10 reps - 3 sec hold - Isometric Shoulder Extension at Wall  - 1-2 x daily - 5 x weekly - 1 sets - 10 reps - 5 sec hold - Standing Isometric Shoulder Flexion with Doorway - Arm Bent  - 1-2 x daily - 5 x weekly - 1 sets - 10 reps - 5 sec hold  ASSESSMENT:  CLINICAL IMPRESSION: 12/23/2024 Patient arrived with 5/10 pain but exited with 0/10. He had some pain with isometric flexion but this resolved after a second set. Pt advised to limit the amount of stretching of the shoulder he is doing at home considering he is having increased pain afterward. The pt will benefit from skilled physical therapy to decrease pain and increase function.    EVAL Ronelle is a 48 y.o. male who was seen today for physical therapy evaluation and treatment for persistent R shoulder pain with mobility deficits. He is also demonstrating decreased postural endurance, and limited strength d/t severity of symptoms. He has related pain and difficulty with overhead reaching, lifting, bathing, heavy household chores, and impaired sleep quality. He requires skilled PT services at this time to address relevant deficits and improve overall function.     OBJECTIVE IMPAIRMENTS: decreased activity tolerance, decreased ROM, decreased strength, impaired UE functional use, improper body mechanics, postural dysfunction, and pain.   ACTIVITY LIMITATIONS: carrying, lifting, sleeping, bed mobility, bathing, dressing, and reach over head  PARTICIPATION LIMITATIONS: meal prep, cleaning, laundry, driving, shopping, and community  activity  PERSONAL FACTORS: Profession, Time since onset of injury/illness/exacerbation, and 3+ comorbidities: Relevant PMHx includes DM, Gout, PAD, PVD, tobacco use are also affecting patient's functional outcome.   REHAB POTENTIAL: Fair  CLINICAL DECISION MAKING: Stable/uncomplicated  EVALUATION COMPLEXITY: Low   GOALS: Goals reviewed with patient? YES  SHORT TERM GOALS: Target date: 12/24/2024   Patient will be independent with initial home program at least 3 days/week.  Baseline: provided at eval Goal Status: INITIAL   2.  Patient will demonstrate improved postural awareness for at least 15 minutes while seated without need for cueing from PT.  Baseline: see objective measures Goal Status: INITIAL   3.  Patient will demonstrate improved R shoulder flexion and abduction AROM by at least 10 degrees Baseline: see objective measures Goal Status: INITIAL     LONG TERM GOALS: Target date: 01/21/2025  Patient will report improved overall functional ability with QuickDash score of 20 or less.  Baseline: 54.5 Goal Status: INITIAL    2.  Patient will demonstrate at least 4+/5 MMT  Baseline: see objective measures  Goal status: INITIAL  3.  Patient will demonstrate ability to perform overhead lifting of at least 10# using appropriate body mechanics and with no more than minimal pain in order to safely perform normal daily/occupational tasks.   Baseline: unable d/t pain with overhead reaching   Goal Status: INITIAL   4.  Patient will report ability to sleep at least 6 hours/night with minimal pain-related sleep disturbance Goal status: INITIAL    PLAN:  PT FREQUENCY: 1-2x/week  PT DURATION: 8 weeks  PLANNED INTERVENTIONS: 97164- PT Re-evaluation, 97750- Physical Performance Testing, 97110-Therapeutic exercises, 97530- Therapeutic activity, [id number]- Neuromuscular re-education, 97535- Self Care, [id number]- Manual therapy, [id number]- Electrical stimulation (unattended), [phone]  Electrical stimulation (manual), 20560 (1-2 muscles), 20561 (3+ muscles)- Dry Needling, Patient/Family education, Taping, Joint mobilization, Spinal mobilization, Cryotherapy, and Moist heat  For all possible CPT codes, reference the Planned Interventions line above.     Check all conditions that are expected to impact treatment: {Conditions expected to impact treatment:Diabetes mellitus and Social determinants of health   If treatment provided at initial evaluation, no treatment charged due to lack of authorization.       PLAN FOR NEXT SESSION: address L shoulder mobility, pain modulation with manual therapy, modalities, UBE as indicated, periscapular strengthening, isometric shoulder strengthening   Marijo DELENA Berber PT  12/23/2024 11:53 AM   "

## 2024-12-30 ENCOUNTER — Ambulatory Visit: Payer: Self-pay | Attending: Orthopedic Surgery

## 2024-12-30 DIAGNOSIS — M25511 Pain in right shoulder: Secondary | ICD-10-CM | POA: Diagnosis present

## 2024-12-30 DIAGNOSIS — G8929 Other chronic pain: Secondary | ICD-10-CM | POA: Insufficient documentation

## 2024-12-30 NOTE — Therapy (Signed)
 " OUTPATIENT PHYSICAL THERAPY TREATMENT NOTE   Patient Name: Fred Green MRN: [id number] DOB:1975-12-30, 49 y.o., male Today's Date: 12/30/2024  END OF SESSION:  PT End of Session - 12/30/24 1218     Visit Number 5    Number of Visits 16    Date for Recertification  01/21/25    Authorization Type [facility] medicaid    Authorization Time Period 12 visits 12/10/24-01/21/25    PT Start Time 1215    PT Stop Time 1255    PT Time Calculation (min) 40 min          Past Medical History:  Diagnosis Date   Diabetes mellitus without complication (HCC)    GERD (gastroesophageal reflux disease)    Gout    Peripheral arterial disease    Peripheral vascular disease    Tobacco abuse    Past Surgical History:  Procedure Laterality Date   ABDOMINAL AORTOGRAM N/A 08/03/2024   Procedure: ABDOMINAL AORTOGRAM;  Surgeon: Sheree Penne Bruckner, MD;  Location: [facility] INVASIVE CV LAB;  Service: Cardiovascular;  Laterality: N/A;   APPLICATION OF WOUND VAC Right 02/20/2024   Procedure: APPLICATION OF WOUND VAC;  Surgeon: Sheree Penne Bruckner, MD;  Location: [facility] OR;  Service: Vascular;  Laterality: Right;   FASCIOTOMY Right 01/26/2024   Procedure: RIGHT LOWER LEG FOUR COMPARTMENT FASCIOTOMY;  Surgeon: Sheree Penne Bruckner, MD;  Location: [facility] OR;  Service: Vascular;  Laterality: Right;   INCISION AND DRAINAGE OF WOUND Right 02/20/2024   Procedure: IRRIGATION AND DEBRIDEMENT WOUND RIGHT LEG;  Surgeon: Sheree Penne Bruckner, MD;  Location: [facility] OR;  Service: Vascular;  Laterality: Right;   INSERTION OF ILIAC STENT Right 01/26/2024   Procedure: INSERTION OF RIGHT POPITEAL ARTERY STENT;  Surgeon: Sheree Penne Bruckner, MD;  Location: [facility] OR;  Service: Vascular;  Laterality: Right;   LOWER EXTREMITY ANGIOGRAM Right 01/26/2024   Procedure: RIGHT LOWER EXTREMITY ANGIOGRAM;  Surgeon: Sheree Penne Bruckner, MD;  Location: [facility] OR;  Service: Vascular;  Laterality: Right;   LOWER EXTREMITY ANGIOGRAPHY N/A  08/03/2024   Procedure: Lower Extremity Angiography;  Surgeon: Sheree Penne Bruckner, MD;  Location: [facility] INVASIVE CV LAB;  Service: Cardiovascular;  Laterality: N/A;   LOWER EXTREMITY INTERVENTION N/A 08/03/2024   Procedure: LOWER EXTREMITY INTERVENTION;  Surgeon: Sheree Penne Bruckner, MD;  Location: [facility] INVASIVE CV LAB;  Service: Cardiovascular;  Laterality: N/A;   NO PAST SURGERIES     PATCH ANGIOPLASTY Right 01/26/2024   Procedure: PATCH ANGIOPLASTY USING GEORGE LISLE;  Surgeon: Sheree Penne Bruckner, MD;  Location: [facility] OR;  Service: Vascular;  Laterality: Right;   THROMBECTOMY OF BYPASS GRAFT FEMORAL- POPLITEAL ARTERY Right 01/26/2024   Procedure: RIGHT LOWER EXTREMITY THROMBECTOMY;  Surgeon: Sheree Penne Bruckner, MD;  Location: [facility] OR;  Service: Vascular;  Laterality: Right;   Patient Active Problem List   Diagnosis Date Noted   Diabetes [facility])    PAD (peripheral artery disease) 02/20/2024   Vascular complication 01/26/2024   Critical limb ischemia of right lower extremity (HCC) 01/26/2024   Back pain 01/11/2016   Tobacco abuse 01/11/2016   Abnormal EKG 01/11/2016   GERD (gastroesophageal reflux disease)    Gastroesophageal reflux disease without esophagitis     PCP: Sebastian Jerilyn HERO, FNP  REFERRING PROVIDER: Beverley Evalene BIRCH, MD  REFERRING DIAG: pain in right shoulder   THERAPY DIAG:  Chronic right shoulder pain  Rationale for Evaluation and Treatment: Rehabilitation  ONSET DATE: 11/10/2024 date of referral  SUBJECTIVE:  SUBJECTIVE STATEMENT: 12/30/2024 Patient reports his pain is worsening and going down his bicep now.   EVAL - Patient reports that he has R shoulder pain that began with a pinched nerve feeling. Patient states that it takes about 30 minutes to subside when it  flares up. He states that he cannot recall an injury, but he worked for years doing gift wrapping and packaging, which required a lot of lifting. Those rolls could go back to 80 pounds   Hand dominance: Right  PERTINENT HISTORY: Relevant PMHx includes DM, Gout, PAD, PVD, tobacco use  PAIN:  Are you having pain?  Yes: NPRS scale: 5/10 current  Pain location: anterior shoulder primarily  Pain description: pulsating Aggravating factors: rolling over on R side, lift arm  Relieving factors: steroid injection (helped for first 5-6 hours)  PRECAUTIONS: None  RED FLAGS: None   WEIGHT BEARING RESTRICTIONS: No  FALLS:  Has patient fallen in last 6 months? No  LIVING ENVIRONMENT: Lives with: lives with their family Lives in: House/apartment Has following equipment at home: None  OCCUPATION: N/a  PLOF: Independent  PATIENT GOALS:to have less shoulder pain with lifting his arm, laying on his R side  NEXT MD VISIT: none scheduled with referring provider  OBJECTIVE:  Note: Objective measures were completed at Evaluation unless otherwise noted.  DIAGNOSTIC FINDINGS:  X-ray of the right shoulder was performed today, showing no evidence of arthritis or stress fracture. Independent interpretation performed by me indicates that further imaging, such as an MRI, may be necessary if symptoms do not improve with initial treatment.   PATIENT SURVEYS:  Quick Dash:  QUICK DASH  Please rate your ability do the following activities in the last week by selecting the number below the appropriate response.   Activities Rating  Open a tight or new jar.  1 = No difficulty   Do heavy household chores (e.g., wash walls, floors). 3 = Moderate difficulty  Carry a shopping bag or briefcase 1 = No difficulty   Wash your back. 5 = Unable  Use a knife to cut food. 1 = No difficulty   Recreational activities in which you take some force or impact through your arm, shoulder or hand (e.g., golf,  hammering, tennis, etc.). 5 = Unable  During the past week, to what extent has your arm, shoulder or hand problem interfered with your normal social activities with family, friends, neighbors or groups?  4 = Quite a bit  During the past week, were you limited in your work or other regular daily activities as a result of your arm, shoulder or hand problem? 5 = Unable  Rate the severity of the following symptoms in the last week: Arm, Shoulder, or hand pain. 5 = Extreme  Rate the severity of the following symptoms in the last week: Tingling (pins and needles) in your arm, shoulder or hand. 1 = none  During the past week, how much difficulty have you had sleeping because of the pain in your arm, shoulder or hand?  4 = Severe difficulty   (A QuickDASH score may not be calculated if there is greater than 1 missing item.)  Quick Dash Disability/Symptom Score: 54.5  Minimally Clinically Important Difference (MCID): 15-20 points  (Franchignoni, F. et al. (2013). Minimally clinically important difference of the disabilities of the arm, shoulder, and hand outcome measures (DASH) and its shortened version (Quick DASH). Journal of Orthopaedic & Sports Physical Therapy, 44(1), 30-39)   COGNITION: Overall cognitive status: Within functional limits for  tasks assessed      POSTURE: Mild shoulder rounding BIL   UPPER EXTREMITY ROM:   Active ROM Right eval Left eval  Shoulder flexion WFL 142  Shoulder extension    Shoulder abduction WFL 95  Shoulder adduction    Shoulder internal rotation  65  Shoulder external rotation  25  Elbow flexion    Elbow extension    Wrist flexion    Wrist extension    Wrist ulnar deviation    Wrist radial deviation    Wrist pronation    Wrist supination    (Blank rows = not tested)  UPPER EXTREMITY MMT: strength assessment limited by pain at initial eval   MMT Right eval Left eval  Shoulder flexion    Shoulder extension    Shoulder abduction    Shoulder  adduction    Shoulder internal rotation    Shoulder external rotation    Middle trapezius    Lower trapezius    Elbow flexion    Elbow extension    Wrist flexion    Wrist extension    Wrist ulnar deviation    Wrist radial deviation    Wrist pronation    Wrist supination    Grip strength (lbs)    (Blank rows = not tested)  PALPATION:  Mild tenderness to palpation along posterior and superior aspect of L shoulder                                                                                                                              TREATMENT DATE:  [facility] Adult PT Treatment:                                                DATE: 12/30/24 Manual Therapy: with heat on shoulder  PROM R shoulder all directions  Education during this time  [facility] Adult PT Treatment:                                                DATE: 12/15/24 Neuro Re-Ed Dowel shoulder flexion x 15  Pulley's flexion and scaption x 10-15 each  Scap retractions 2 x 10   Manual:  PROM R shoulder all directions  Grade II/III glenohumeral jt mobilizations   [facility] Adult PT Treatment:                                                DATE: 11/26/2024   Initial evaluation: see patient education and home exercise program as noted below     PATIENT EDUCATION: Education  details: reviewed initial home exercise program; discussion of POC, prognosis and goals for skilled PT   Person educated: Patient Education method: Explanation, Demonstration, and Handouts Education comprehension: verbalized understanding, returned demonstration, and needs further education  HOME EXERCISE PROGRAM: Access Code: [email or web address] URL: [email or web address] Date: 11/26/2024 Prepared by: Marko Molt  Exercises - Seated Scapular Retraction  - 1-2 x daily - 5 x weekly - 1 sets - 10 reps - 3 sec hold - Seated Shoulder Shrugs  - 1-2 x daily - 5 x weekly - 1 sets - 10 reps - 3 sec hold - Isometric Shoulder Extension at Wall  - 1-2 x  daily - 5 x weekly - 1 sets - 10 reps - 5 sec hold - Standing Isometric Shoulder Flexion with Doorway - Arm Bent  - 1-2 x daily - 5 x weekly - 1 sets - 10 reps - 5 sec hold  ASSESSMENT:  CLINICAL IMPRESSION: 12/30/2024 Patient advised to cancel tomorrow's appointment. He is to see his PCP tomorrow and will update PT on future plans at the next appointment. Pt's pain is worsening therefore heat and gentle PROM only performed today. The pt will benefit from skilled physical therapy to decrease pain and increase function.    EVAL Fred Green is a 49 y.o. male who was seen today for physical therapy evaluation and treatment for persistent R shoulder pain with mobility deficits. He is also demonstrating decreased postural endurance, and limited strength d/t severity of symptoms. He has related pain and difficulty with overhead reaching, lifting, bathing, heavy household chores, and impaired sleep quality. He requires skilled PT services at this time to address relevant deficits and improve overall function.     OBJECTIVE IMPAIRMENTS: decreased activity tolerance, decreased ROM, decreased strength, impaired UE functional use, improper body mechanics, postural dysfunction, and pain.   ACTIVITY LIMITATIONS: carrying, lifting, sleeping, bed mobility, bathing, dressing, and reach over head  PARTICIPATION LIMITATIONS: meal prep, cleaning, laundry, driving, shopping, and community activity  PERSONAL FACTORS: Profession, Time since onset of injury/illness/exacerbation, and 3+ comorbidities: Relevant PMHx includes DM, Gout, PAD, PVD, tobacco use are also affecting patient's functional outcome.   REHAB POTENTIAL: Fair    CLINICAL DECISION MAKING: Stable/uncomplicated  EVALUATION COMPLEXITY: Low   GOALS: Goals reviewed with patient? YES  SHORT TERM GOALS: Target date: 12/24/2024   Patient will be independent with initial home program at least 3 days/week.  Baseline: provided at eval Goal Status: INITIAL    2.  Patient will demonstrate improved postural awareness for at least 15 minutes while seated without need for cueing from PT.  Baseline: see objective measures Goal Status: INITIAL   3.  Patient will demonstrate improved R shoulder flexion and abduction AROM by at least 10 degrees Baseline: see objective measures Goal Status: INITIAL     LONG TERM GOALS: Target date: 01/21/2025  Patient will report improved overall functional ability with QuickDash score of 20 or less.  Baseline: 54.5 Goal Status: INITIAL    2.  Patient will demonstrate at least 4+/5 MMT  Baseline: see objective measures  Goal status: INITIAL  3.  Patient will demonstrate ability to perform overhead lifting of at least 10# using appropriate body mechanics and with no more than minimal pain in order to safely perform normal daily/occupational tasks.   Baseline: unable d/t pain with overhead reaching   Goal Status: INITIAL   4.  Patient will report ability to sleep at least 6 hours/night with minimal pain-related sleep disturbance Goal status: INITIAL  PLAN:  PT FREQUENCY: 1-2x/week  PT DURATION: 8 weeks  PLANNED INTERVENTIONS: 97164- PT Re-evaluation, 97750- Physical Performance Testing, 97110-Therapeutic exercises, 97530- Therapeutic activity, [id number]- Neuromuscular re-education, 97535- Self Care, [id number]- Manual therapy, [id number]- Electrical stimulation (unattended), [phone]- Electrical stimulation (manual), [phone] (1-2 muscles), 20561 (3+ muscles)- Dry Needling, Patient/Family education, Taping, Joint mobilization, Spinal mobilization, Cryotherapy, and Moist heat  For all possible CPT codes, reference the Planned Interventions line above.     Check all conditions that are expected to impact treatment: {Conditions expected to impact treatment:Diabetes mellitus and Social determinants of health   If treatment provided at initial evaluation, no treatment charged due to lack of authorization.       PLAN FOR  NEXT SESSION: address L shoulder mobility, pain modulation with manual therapy, modalities, UBE as indicated, periscapular strengthening, isometric shoulder strengthening   Marijo DELENA Berber PT  12/30/2024 12:40 PM   "

## 2024-12-31 ENCOUNTER — Ambulatory Visit: Payer: Self-pay

## 2025-01-05 ENCOUNTER — Ambulatory Visit: Payer: Self-pay

## 2025-01-05 DIAGNOSIS — G8929 Other chronic pain: Secondary | ICD-10-CM

## 2025-01-05 NOTE — Therapy (Signed)
 " OUTPATIENT PHYSICAL THERAPY TREATMENT NOTE  No visit charge today as pt was advised last session to see his orthopedic before returning to PT. At this time, an appointment with ortho has not been scheduled.    Patient Name: Fred Green MRN: [id number] DOB:1976-02-01, 49 y.o., male Today's Date: 01/05/2025  END OF SESSION:  PT End of Session - 01/05/25 1205     Visit Number 6    Number of Visits 16    Date for Recertification  01/21/25    Authorization Type [employer] medicaid    Authorization Time Period 12 visits 12/10/24-01/21/25    PT Start Time 1210    PT Stop Time 1250    PT Time Calculation (min) 40 min    Activity Tolerance Patient limited by pain;Patient tolerated treatment well    Behavior During Therapy [facility] for tasks assessed/performed           Past Medical History:  Diagnosis Date   Diabetes mellitus without complication (HCC)    GERD (gastroesophageal reflux disease)    Gout    Peripheral arterial disease    Peripheral vascular disease    Tobacco abuse    Past Surgical History:  Procedure Laterality Date   ABDOMINAL AORTOGRAM N/A 08/03/2024   Procedure: ABDOMINAL AORTOGRAM;  Surgeon: Sheree Penne Bruckner, MD;  Location: [facility] INVASIVE CV LAB;  Service: Cardiovascular;  Laterality: N/A;   APPLICATION OF WOUND VAC Right 02/20/2024   Procedure: APPLICATION OF WOUND VAC;  Surgeon: Sheree Penne Bruckner, MD;  Location: [facility] OR;  Service: Vascular;  Laterality: Right;   FASCIOTOMY Right 01/26/2024   Procedure: RIGHT LOWER LEG FOUR COMPARTMENT FASCIOTOMY;  Surgeon: Sheree Penne Bruckner, MD;  Location: [facility] OR;  Service: Vascular;  Laterality: Right;   INCISION AND DRAINAGE OF WOUND Right 02/20/2024   Procedure: IRRIGATION AND DEBRIDEMENT WOUND RIGHT LEG;  Surgeon: Sheree Penne Bruckner, MD;  Location: [facility] OR;  Service: Vascular;  Laterality: Right;   INSERTION OF ILIAC STENT Right 01/26/2024   Procedure: INSERTION OF RIGHT POPITEAL ARTERY STENT;  Surgeon: Sheree Penne Bruckner, MD;  Location: [facility] OR;  Service: Vascular;  Laterality: Right;   LOWER EXTREMITY ANGIOGRAM Right 01/26/2024   Procedure: RIGHT LOWER EXTREMITY ANGIOGRAM;  Surgeon: Sheree Penne Bruckner, MD;  Location: [facility] OR;  Service: Vascular;  Laterality: Right;   LOWER EXTREMITY ANGIOGRAPHY N/A 08/03/2024   Procedure: Lower Extremity Angiography;  Surgeon: Sheree Penne Bruckner, MD;  Location: [facility] INVASIVE CV LAB;  Service: Cardiovascular;  Laterality: N/A;   LOWER EXTREMITY INTERVENTION N/A 08/03/2024   Procedure: LOWER EXTREMITY INTERVENTION;  Surgeon: Sheree Penne Bruckner, MD;  Location: [facility] INVASIVE CV LAB;  Service: Cardiovascular;  Laterality: N/A;   NO PAST SURGERIES     PATCH ANGIOPLASTY Right 01/26/2024   Procedure: PATCH ANGIOPLASTY USING GEORGE LISLE;  Surgeon: Sheree Penne Bruckner, MD;  Location: [facility] OR;  Service: Vascular;  Laterality: Right;   THROMBECTOMY OF BYPASS GRAFT FEMORAL- POPLITEAL ARTERY Right 01/26/2024   Procedure: RIGHT LOWER EXTREMITY THROMBECTOMY;  Surgeon: Sheree Penne Bruckner, MD;  Location: [facility] OR;  Service: Vascular;  Laterality: Right;   Patient Active Problem List   Diagnosis Date Noted   Diabetes (HCC)    PAD (peripheral artery disease) 02/20/2024   Vascular complication 01/26/2024   Critical limb ischemia of right lower extremity (HCC) 01/26/2024   Back pain 01/11/2016   Tobacco abuse 01/11/2016   Abnormal EKG 01/11/2016   GERD (gastroesophageal reflux disease)    Gastroesophageal reflux disease without esophagitis  PCP: Sebastian Jerilyn HERO, FNP  REFERRING PROVIDER: Beverley Evalene BIRCH, MD  REFERRING DIAG: pain in right shoulder   THERAPY DIAG:  Chronic right shoulder pain  Rationale for Evaluation and Treatment: Rehabilitation  ONSET DATE: 11/10/2024 date of referral  SUBJECTIVE:                                                                                                                                                                                       SUBJECTIVE STATEMENT: 01/05/2025 Patient reports his pain is worsening and going down his bicep now.   EVAL - Patient reports that he has R shoulder pain that began with a pinched nerve feeling. Patient states that it takes about 30 minutes to subside when it flares up. He states that he cannot recall an injury, but he worked for years doing gift wrapping and packaging, which required a lot of lifting. Those rolls could go back to 80 pounds   Hand dominance: Right  PERTINENT HISTORY: Relevant PMHx includes DM, Gout, PAD, PVD, tobacco use  PAIN:  Are you having pain?  Yes: NPRS scale: 5/10 current  Pain location: anterior shoulder primarily  Pain description: pulsating Aggravating factors: rolling over on R side, lift arm  Relieving factors: steroid injection (helped for first 5-6 hours)  PRECAUTIONS: None  RED FLAGS: None   WEIGHT BEARING RESTRICTIONS: No  FALLS:  Has patient fallen in last 6 months? No  LIVING ENVIRONMENT: Lives with: lives with their family Lives in: House/apartment Has following equipment at home: None  OCCUPATION: N/a  PLOF: Independent  PATIENT GOALS:to have less shoulder pain with lifting his arm, laying on his R side  NEXT MD VISIT: none scheduled with referring provider  OBJECTIVE:  Note: Objective measures were completed at Evaluation unless otherwise noted.  DIAGNOSTIC FINDINGS:  X-ray of the right shoulder was performed today, showing no evidence of arthritis or stress fracture. Independent interpretation performed by me indicates that further imaging, such as an MRI, may be necessary if symptoms do not improve with initial treatment.   PATIENT SURVEYS:  Quick Dash:  QUICK DASH  Please rate your ability do the following activities in the last week by selecting the number below the appropriate response.   Activities Rating  Open a tight or new jar.  1 = No difficulty   Do heavy household  chores (e.g., wash walls, floors). 3 = Moderate difficulty  Carry a shopping bag or briefcase 1 = No difficulty   Wash your back. 5 = Unable  Use a knife to cut food. 1 = No difficulty   Recreational activities in which you take some force or impact through  your arm, shoulder or hand (e.g., golf, hammering, tennis, etc.). 5 = Unable  During the past week, to what extent has your arm, shoulder or hand problem interfered with your normal social activities with family, friends, neighbors or groups?  4 = Quite a bit  During the past week, were you limited in your work or other regular daily activities as a result of your arm, shoulder or hand problem? 5 = Unable  Rate the severity of the following symptoms in the last week: Arm, Shoulder, or hand pain. 5 = Extreme  Rate the severity of the following symptoms in the last week: Tingling (pins and needles) in your arm, shoulder or hand. 1 = none  During the past week, how much difficulty have you had sleeping because of the pain in your arm, shoulder or hand?  4 = Severe difficulty   (A QuickDASH score may not be calculated if there is greater than 1 missing item.)  Quick Dash Disability/Symptom Score: 54.5  Minimally Clinically Important Difference (MCID): 15-20 points  (Franchignoni, F. et al. (2013). Minimally clinically important difference of the disabilities of the arm, shoulder, and hand outcome measures (DASH) and its shortened version (Quick DASH). Journal of Orthopaedic & Sports Physical Therapy, 44(1), 30-39)   COGNITION: Overall cognitive status: Within functional limits for tasks assessed      POSTURE: Mild shoulder rounding BIL   UPPER EXTREMITY ROM:   Active ROM Right eval Left eval  Shoulder flexion WFL 142  Shoulder extension    Shoulder abduction WFL 95  Shoulder adduction    Shoulder internal rotation  65  Shoulder external rotation  25  Elbow flexion    Elbow extension    Wrist flexion    Wrist extension    Wrist  ulnar deviation    Wrist radial deviation    Wrist pronation    Wrist supination    (Blank rows = not tested)  UPPER EXTREMITY MMT: strength assessment limited by pain at initial eval   MMT Right eval Left eval  Shoulder flexion    Shoulder extension    Shoulder abduction    Shoulder adduction    Shoulder internal rotation    Shoulder external rotation    Middle trapezius    Lower trapezius    Elbow flexion    Elbow extension    Wrist flexion    Wrist extension    Wrist ulnar deviation    Wrist radial deviation    Wrist pronation    Wrist supination    Grip strength (lbs)    (Blank rows = not tested)  PALPATION:  Mild tenderness to palpation along posterior and superior aspect of L shoulder                                                                                                                              TREATMENT DATE:  [facility] Adult PT Treatment:  DATE: 12/30/24 Manual Therapy: with heat on shoulder  PROM R shoulder all directions  Education during this time  [facility] Adult PT Treatment:                                                DATE: 12/15/24 Neuro Re-Ed Dowel shoulder flexion x 15  Pulley's flexion and scaption x 10-15 each  Scap retractions 2 x 10   Manual:  PROM R shoulder all directions  Grade II/III glenohumeral jt mobilizations   [facility] Adult PT Treatment:                                                DATE: 11/26/2024   Initial evaluation: see patient education and home exercise program as noted below     PATIENT EDUCATION: Education details: reviewed initial home exercise program; discussion of POC, prognosis and goals for skilled PT   Person educated: Patient Education method: Explanation, Demonstration, and Handouts Education comprehension: verbalized understanding, returned demonstration, and needs further education  HOME EXERCISE PROGRAM: Access Code: [email or web address] URL:  [email or web address] Date: 11/26/2024 Prepared by: Marko Molt  Exercises - Seated Scapular Retraction  - 1-2 x daily - 5 x weekly - 1 sets - 10 reps - 3 sec hold - Seated Shoulder Shrugs  - 1-2 x daily - 5 x weekly - 1 sets - 10 reps - 3 sec hold - Isometric Shoulder Extension at Wall  - 1-2 x daily - 5 x weekly - 1 sets - 10 reps - 5 sec hold - Standing Isometric Shoulder Flexion with Doorway - Arm Bent  - 1-2 x daily - 5 x weekly - 1 sets - 10 reps - 5 sec hold  ASSESSMENT:  CLINICAL IMPRESSION: 01/05/2025 Patient advised to cancel tomorrow's appointment. He is to see his PCP tomorrow and will update PT on future plans at the next appointment. Pt's pain is worsening therefore heat and gentle PROM only performed today. The pt will benefit from skilled physical therapy to decrease pain and increase function.    EVAL Hazael is a 49 y.o. male who was seen today for physical therapy evaluation and treatment for persistent R shoulder pain with mobility deficits. He is also demonstrating decreased postural endurance, and limited strength d/t severity of symptoms. He has related pain and difficulty with overhead reaching, lifting, bathing, heavy household chores, and impaired sleep quality. He requires skilled PT services at this time to address relevant deficits and improve overall function.     OBJECTIVE IMPAIRMENTS: decreased activity tolerance, decreased ROM, decreased strength, impaired UE functional use, improper body mechanics, postural dysfunction, and pain.   ACTIVITY LIMITATIONS: carrying, lifting, sleeping, bed mobility, bathing, dressing, and reach over head  PARTICIPATION LIMITATIONS: meal prep, cleaning, laundry, driving, shopping, and community activity  PERSONAL FACTORS: Profession, Time since onset of injury/illness/exacerbation, and 3+ comorbidities: Relevant PMHx includes DM, Gout, PAD, PVD, tobacco use are also affecting patient's functional outcome.   REHAB  POTENTIAL: Fair    CLINICAL DECISION MAKING: Stable/uncomplicated  EVALUATION COMPLEXITY: Low   GOALS: Goals reviewed with patient? YES  SHORT TERM GOALS: Target date: 12/24/2024   Patient will be independent with initial home program at least 3 days/week.  Baseline: provided at eval  Goal Status: INITIAL   2.  Patient will demonstrate improved postural awareness for at least 15 minutes while seated without need for cueing from PT.  Baseline: see objective measures Goal Status: INITIAL   3.  Patient will demonstrate improved R shoulder flexion and abduction AROM by at least 10 degrees Baseline: see objective measures Goal Status: INITIAL     LONG TERM GOALS: Target date: 01/21/2025  Patient will report improved overall functional ability with QuickDash score of 20 or less.  Baseline: 54.5 Goal Status: INITIAL    2.  Patient will demonstrate at least 4+/5 MMT  Baseline: see objective measures  Goal status: INITIAL  3.  Patient will demonstrate ability to perform overhead lifting of at least 10# using appropriate body mechanics and with no more than minimal pain in order to safely perform normal daily/occupational tasks.   Baseline: unable d/t pain with overhead reaching   Goal Status: INITIAL   4.  Patient will report ability to sleep at least 6 hours/night with minimal pain-related sleep disturbance Goal status: INITIAL    PLAN:  PT FREQUENCY: 1-2x/week  PT DURATION: 8 weeks  PLANNED INTERVENTIONS: 97164- PT Re-evaluation, 97750- Physical Performance Testing, 97110-Therapeutic exercises, 97530- Therapeutic activity, [id number]- Neuromuscular re-education, 97535- Self Care, [id number]- Manual therapy, [id number]- Electrical stimulation (unattended), [phone]- Electrical stimulation (manual), 20560 (1-2 muscles), 20561 (3+ muscles)- Dry Needling, Patient/Family education, Taping, Joint mobilization, Spinal mobilization, Cryotherapy, and Moist heat  For all possible CPT codes,  reference the Planned Interventions line above.     Check all conditions that are expected to impact treatment: {Conditions expected to impact treatment:Diabetes mellitus and Social determinants of health   If treatment provided at initial evaluation, no treatment charged due to lack of authorization.       PLAN FOR NEXT SESSION: address L shoulder mobility, pain modulation with manual therapy, modalities, UBE as indicated, periscapular strengthening, isometric shoulder strengthening   Marijo DELENA Berber PT  01/05/2025 12:05 PM   "

## 2025-01-07 ENCOUNTER — Ambulatory Visit: Payer: Self-pay

## 2025-01-12 ENCOUNTER — Other Ambulatory Visit: Payer: Self-pay | Admitting: Orthopedic Surgery

## 2025-01-12 ENCOUNTER — Ambulatory Visit: Payer: Self-pay

## 2025-01-12 DIAGNOSIS — M25511 Pain in right shoulder: Secondary | ICD-10-CM

## 2025-01-14 ENCOUNTER — Ambulatory Visit: Payer: Self-pay

## 2025-01-19 ENCOUNTER — Ambulatory Visit: Payer: Self-pay

## 2025-01-21 ENCOUNTER — Ambulatory Visit: Payer: Self-pay

## 2025-01-26 ENCOUNTER — Ambulatory Visit
Admission: RE | Admit: 2025-01-26 | Discharge: 2025-01-26 | Disposition: A | Source: Ambulatory Visit | Attending: Orthopedic Surgery

## 2025-01-26 ENCOUNTER — Inpatient Hospital Stay
Admission: RE | Admit: 2025-01-26 | Discharge: 2025-01-26 | Disposition: A | Source: Ambulatory Visit | Attending: Orthopedic Surgery

## 2025-01-26 DIAGNOSIS — M25511 Pain in right shoulder: Secondary | ICD-10-CM

## 2025-01-26 MED ORDER — IOPAMIDOL (ISOVUE-M 200) INJECTION 41%
10.0000 mL | Freq: Once | INTRAMUSCULAR | Status: AC
  Administered 2025-01-26: 10 mL via INTRA_ARTICULAR

## 2025-02-16 ENCOUNTER — Ambulatory Visit: Admitting: Podiatry

## 2025-03-09 ENCOUNTER — Ambulatory Visit: Admitting: Podiatry

## 2025-03-24 ENCOUNTER — Encounter (HOSPITAL_COMMUNITY)

## 2025-03-24 ENCOUNTER — Ambulatory Visit

## 2025-05-04 ENCOUNTER — Ambulatory Visit: Admitting: Podiatry
